# Patient Record
Sex: Female | Born: 1975 | Race: White | Hispanic: No | Marital: Married | Smoking: Former smoker
Health system: Southern US, Community
[De-identification: ages and names within clinical notes are randomized; demographics above are authoritative.]

## PROBLEM LIST (undated history)

## (undated) DIAGNOSIS — E041 Nontoxic single thyroid nodule: Secondary | ICD-10-CM

## (undated) DIAGNOSIS — E063 Autoimmune thyroiditis: Secondary | ICD-10-CM

## (undated) DIAGNOSIS — F32A Depression, unspecified: Secondary | ICD-10-CM

## (undated) DIAGNOSIS — M797 Fibromyalgia: Secondary | ICD-10-CM

## (undated) DIAGNOSIS — K219 Gastro-esophageal reflux disease without esophagitis: Secondary | ICD-10-CM

## (undated) DIAGNOSIS — R011 Cardiac murmur, unspecified: Secondary | ICD-10-CM

## (undated) DIAGNOSIS — Z87442 Personal history of urinary calculi: Secondary | ICD-10-CM

## (undated) DIAGNOSIS — R5382 Chronic fatigue, unspecified: Secondary | ICD-10-CM

## (undated) DIAGNOSIS — K649 Unspecified hemorrhoids: Secondary | ICD-10-CM

## (undated) DIAGNOSIS — K603 Anal fistula, unspecified: Secondary | ICD-10-CM

## (undated) DIAGNOSIS — F329 Major depressive disorder, single episode, unspecified: Secondary | ICD-10-CM

## (undated) HISTORY — DX: Autoimmune thyroiditis: E06.3

## (undated) HISTORY — DX: Unspecified hemorrhoids: K64.9

## (undated) HISTORY — PX: LEEP: SHX91

## (undated) HISTORY — DX: Depression, unspecified: F32.A

## (undated) HISTORY — PX: WISDOM TOOTH EXTRACTION: SHX21

## (undated) HISTORY — DX: Nontoxic single thyroid nodule: E04.1

## (undated) HISTORY — DX: Major depressive disorder, single episode, unspecified: F32.9

## (undated) HISTORY — DX: Cardiac murmur, unspecified: R01.1

## (undated) HISTORY — PX: COLONOSCOPY: SHX174

## (undated) HISTORY — DX: Chronic fatigue, unspecified: R53.82

---

## 2003-02-13 HISTORY — PX: PLACEMENT OF BREAST IMPLANTS: SHX6334

## 2011-07-31 ENCOUNTER — Ambulatory Visit (INDEPENDENT_AMBULATORY_CARE_PROVIDER_SITE_OTHER): Payer: 59 | Admitting: Internal Medicine

## 2011-07-31 VITALS — BP 117/74 | HR 60 | Temp 98.7°F | Resp 20 | Ht 67.0 in | Wt 195.0 lb

## 2011-07-31 DIAGNOSIS — R5383 Other fatigue: Secondary | ICD-10-CM

## 2011-07-31 DIAGNOSIS — E039 Hypothyroidism, unspecified: Secondary | ICD-10-CM

## 2011-07-31 DIAGNOSIS — R5381 Other malaise: Secondary | ICD-10-CM

## 2011-07-31 DIAGNOSIS — G629 Polyneuropathy, unspecified: Secondary | ICD-10-CM | POA: Insufficient documentation

## 2011-07-31 LAB — POCT CBC
Granulocyte percent: 61.8 %G (ref 37–80)
MCV: 90.8 fL (ref 80–97)
MID (cbc): 0.3 (ref 0–0.9)
MPV: 8.7 fL (ref 0–99.8)
Platelet Count, POC: 233 10*3/uL (ref 142–424)
RBC: 5.01 M/uL (ref 4.04–5.48)

## 2011-07-31 LAB — COMPREHENSIVE METABOLIC PANEL
ALT: 10 U/L (ref 0–35)
AST: 12 U/L (ref 0–37)
Albumin: 4.5 g/dL (ref 3.5–5.2)
BUN: 11 mg/dL (ref 6–23)
Calcium: 9.3 mg/dL (ref 8.4–10.5)
Chloride: 105 mEq/L (ref 96–112)
Potassium: 4.2 mEq/L (ref 3.5–5.3)
Sodium: 139 mEq/L (ref 135–145)
Total Protein: 6.9 g/dL (ref 6.0–8.3)

## 2011-07-31 LAB — T4, FREE: Free T4: 1.37 ng/dL (ref 0.80–1.80)

## 2011-07-31 NOTE — Progress Notes (Addendum)
Subjective:    Patient ID: Lindsey Cuevas, female    DOB: 18-May-1975, 36 y.o.   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 She has had hypothyroidism for 13 years. Recently she notes increased fatigue, increased weight gain, thickening of the skin, the skin irritated lesions. She also has a history of a fatigue syndrome that started when she was under a great deal of stress 2 years ago and led to the diagnosis of fibromyalgia. It included fatigue myalgias and anxiety and diarrhea. She is stable at this point though her resolution came without any specific type of medical treatment. She also has a diagnosis of a neuropathy of some sort and she noticed it first with numbness on her feet while walking. She had a peripheral nerve conduction study which confirmed a rub but the but there was no etiology. At this point it is intermittent it and seems to get worse the longer she is on her feet.  She is  everyday smoker  She is requesting a referral to endocrinologist Dr. Sharl Ma  Review of Systems  Constitutional: Negative for fever, activity change and appetite change.  HENT: Negative for hearing loss, congestion, facial swelling, mouth sores and trouble swallowing.   Eyes: Negative for discharge and visual disturbance.  Respiratory: Negative for apnea, choking and wheezing.   Cardiovascular: Negative for chest pain, palpitations and leg swelling.  Gastrointestinal: Negative for abdominal pain, diarrhea, constipation and blood in stool.  Genitourinary: Negative for dysuria, difficulty urinating, menstrual problem, pelvic pain and dyspareunia.  Musculoskeletal: Negative for arthralgias.       She has frequent myalgias but no arthralgias or joint complaints  Neurological: Negative for dizziness and headaches.  Hematological: Negative for adenopathy. Does not bruise/bleed easily.  Psychiatric/Behavioral: Negative for  hallucinations, confusion, disturbed wake/sleep cycle, dysphoric mood and decreased concentration.       Objective:   Physical Exam Vital signs stable except weight 195 HEENT clear including no thyromegaly or nodules Heart regular without murmur Lungs clear Extremities without edema Skin has several small red irritated areas that are adjacent to follicles and evolve into hyperpigmented spots but had no specific characteristics suggesting a diagnosis. Neurological-not tested on this exam for peripheral neuropathy   Results for orders placed in visit on 07/31/11  POCT CBC      Component Value Range   WBC 6.1  4.6 - 10.2 K/uL   Lymph, poc 2.0  0.6 - 3.4   POC LYMPH PERCENT 32.7  10 - 50 %L   MID (cbc) 0.3  0 - 0.9   POC MID % 5.5  0 - 12 %M   POC Granulocyte 3.8  2 - 6.9   Granulocyte percent 61.8  37 - 80 %G   RBC 5.01  4.04 - 5.48 M/uL   Hemoglobin 15.3  12.2 - 16.2 g/dL   HCT, POC 40.9  81.1 - 47.9 %   MCV 90.8  80 - 97 fL   MCH, POC 30.5  27 - 31.2 pg   MCHC 33.6  31.8 - 35.4 g/dL   RDW, POC 91.4     Platelet Count, POC 233  142 - 424 K/uL   MPV 8.7  0 - 99.8 fL       Assessment & Plan:  Impression #1 hypothyroidism #2 fatigue #3 history of diagnosis of fibromyalgia #4 history of diagnosis of neuropathy  T3-T4 and TSH ordered Will refill level thyroxine  after those results Referral to endocrinology   Addendum 08/01/2011-labs normal will refill thyroxine

## 2011-08-01 ENCOUNTER — Encounter: Payer: Self-pay | Admitting: Internal Medicine

## 2011-08-01 MED ORDER — LEVOTHYROXINE SODIUM 112 MCG PO TABS: 125.0000 ug | ORAL_TABLET | Freq: Every day | ORAL | Status: DC

## 2011-08-01 NOTE — Addendum Note (Signed)
Addended by: Tonye Pearson on: 08/01/2011 05:47 PM   Modules accepted: Orders

## 2011-08-02 ENCOUNTER — Telehealth: Payer: Self-pay

## 2011-08-02 NOTE — Telephone Encounter (Signed)
Let her know that I mailed her labs yesterday and sent in her RX, that all was WNL And I've requested an appt w/ Dr Sharl Ma

## 2011-08-02 NOTE — Telephone Encounter (Signed)
Patient needs a refill levoxyl .125.  She saw Merla Riches and he said pending her lab results, they may have to increase this medicine.  Now she is out and hasn't heard anything about her labs.  Please call

## 2011-08-04 NOTE — Telephone Encounter (Signed)
LMOM with message from Dr Reggy Eye

## 2011-09-28 ENCOUNTER — Ambulatory Visit (INDEPENDENT_AMBULATORY_CARE_PROVIDER_SITE_OTHER): Payer: 59 | Admitting: Family Medicine

## 2011-09-28 VITALS — BP 116/77 | HR 80 | Temp 99.2°F | Resp 16 | Ht 68.2 in | Wt 193.0 lb

## 2011-09-28 DIAGNOSIS — E039 Hypothyroidism, unspecified: Secondary | ICD-10-CM

## 2011-09-28 DIAGNOSIS — J029 Acute pharyngitis, unspecified: Secondary | ICD-10-CM

## 2011-09-28 LAB — POCT CBC
MCH, POC: 30 pg (ref 27–31.2)
MCHC: 31.8 g/dL (ref 31.8–35.4)
MCV: 94.4 fL (ref 80–97)
MID (cbc): 0.6 (ref 0–0.9)
POC LYMPH PERCENT: 17.4 %L (ref 10–50)
POC MID %: 5.7 %M (ref 0–12)
Platelet Count, POC: 254 10*3/uL (ref 142–424)
RDW, POC: 12.2 %
WBC: 11.2 10*3/uL — AB (ref 4.6–10.2)

## 2011-09-28 NOTE — Progress Notes (Addendum)
[facility] [address] [place] [address] [phone]  Date:  09/28/2011   Name:  Lindsey Cuevas   DOB:  Oct 28, 1975   MRN:  [id number]  PCP:  No primary provider on file.    Chief Complaint: Fatigue and Sore Throat   History of Present Illness:  Lindsey Cuevas is a 36 y.o. very pleasant female patient who presents with the following:  About 2 days ago she noted onset of ST and enlarged right tonsil.  Yesterday she felt worse and tried some throat lozenges.  Slept poorly last night due to the size of her tonsils- was going to go to work today, but felt too bad.  Symptoms are just on the right side of her throat, and her right ear hurts as well.  She is able to swallow liquids and soft foods.    She has noted chills, and has felt hot ut has not measured a temperature.  No other symptoms such as cough or GI symptoms.  She has not taken any antipyretics today.    She has felt fatigued.  They did change her thyroid medication about 6 weeks ago as her old brand was discontinued- she wonders if her TSH may be elevated. She has had problems with changes in her brand in the past.  She would like to go on brand- name synthroid is possible- she is currently on generic levothyroxine.    Recently moved to [place], she is an Charity fundraiser at [employer].  Here with her husband today.   No chance of pregnancy- last period was late July.   PHM includes hypothyroidism, fibromyalgia.     Patient Active Problem List  Diagnosis  . Neuropathy  . Hypothyroidism    No past medical history on file.  No past surgical history on file.  History  Substance Use Topics  . Smoking status: Current Everyday Smoker -- 0.5 packs/day    Types: Cigarettes  . Smokeless tobacco: Not on file  . Alcohol Use: Not on file    No family history on file.  Allergies  Allergen Reactions  . Percocet (Oxycodone-Acetaminophen) Nausea And Vomiting    Projectile vomiting.    Medication list has been reviewed  and updated.  Current Outpatient Prescriptions on File Prior to Visit  Medication Sig Dispense Refill  . cetirizine (ZYRTEC) 10 MG tablet Take 10 mg by mouth daily.      Marland Kitchen levothyroxine (SYNTHROID, LEVOTHROID) 112 MCG tablet Take 1 tablet (112 mcg total) by mouth daily.  90 tablet  3    Review of Systems:  As per HPI- otherwise negative.   Physical Examination: Filed Vitals:   09/28/11 0843  BP: 116/77  Pulse: 80  Temp: 99.2 F (37.3 C)  Resp: 16   Filed Vitals:   09/28/11 0843  Height: 5' 8.2" (1.732 m)  Weight: 193 lb (87.544 kg)   Body mass index is 29.17 kg/(m^2). Ideal Body Weight: Weight in (lb) to have BMI = 25: 165   GEN: WDWN, NAD, Non-toxic, A & O x 3 HEENT: Atraumatic, Normocephalic. Neck supple. No masses, minimal LAD.  TM wnl bilaterally, PEERL.  Oropharynx: large, firm right tonsil.  Uvula is displaced towards the left side of her oropharynx.  No visible exudate.  Suspect right tonsillar abscess.   Ears and Nose: No external deformity. CV: RRR, No M/G/R. No JVD. No thrill. No extra heart sounds. PULM: CTA B, no wheezes, crackles, rhonchi. No retractions. No resp. distress. No accessory muscle  use. EXTR: No c/c/e NEURO Normal gait.  PSYCH: Normally interactive. Conversant. Not depressed or anxious appearing.  Calm demeanor.   Results for orders placed in visit on 09/28/11  POCT RAPID STREP A (OFFICE)      Component Value Range   Rapid Strep A Screen Negative  Negative  POCT CBC      Component Value Range   WBC 11.2 (*) 4.6 - 10.2 K/uL   Lymph, poc 1.9  0.6 - 3.4   POC LYMPH PERCENT 17.4  10 - 50 %L   MID (cbc) 0.6  0 - 0.9   POC MID % 5.7  0 - 12 %M   POC Granulocyte 8.6 (*) 2 - 6.9   Granulocyte percent 76.9  37 - 80 %G   RBC 5.16  4.04 - 5.48 M/uL   Hemoglobin 15.5  12.2 - 16.2 g/dL   HCT, POC 16.1 (*) 09.6 - 47.9 %   MCV 94.4  80 - 97 fL   MCH, POC 30.0  27 - 31.2 pg   MCHC 31.8  31.8 - 35.4 g/dL   RDW, POC 04.5     Platelet Count, POC 254   142 - 424 K/uL   MPV 8.7  0 - 99.8 fL    Assessment and Plan: 1. Sore throat  POCT rapid strep A, POCT CBC, Culture, Group A Strep  2. Hypothyroidism  TSH   Await TSH- will change to brand synthroid per her request, but want to be sure we do not need to change her dosage as well first.   Pharyngitis with concern for tonsillar abscess.  As it is Friday morning, would like for her to have an ENT evaluation prior to the weekend.  Appreciate kind consultation per [facility] ENT Throat culture is pending.    Virgilene Stryker, MD  Called to check on her today- she did have an I and D of abscess yesterday.  She is doing ok.  Let her know that her TSH is ok- called in brand name synthroid per her request.

## 2011-09-29 ENCOUNTER — Encounter: Payer: Self-pay | Admitting: Family Medicine

## 2011-09-29 MED ORDER — LEVOTHYROXINE SODIUM 112 MCG PO TABS: 112.0000 ug | ORAL_TABLET | Freq: Every day | ORAL | Status: DC

## 2011-09-29 NOTE — Addendum Note (Signed)
Addended by: Abbe Amsterdam C on: 09/29/2011 02:01 PM   Modules accepted: Orders

## 2011-09-30 LAB — CULTURE, GROUP A STREP: Organism ID, Bacteria: NORMAL

## 2011-10-01 ENCOUNTER — Encounter: Payer: Self-pay | Admitting: Family Medicine

## 2011-10-15 HISTORY — PX: INCISION AND DRAINAGE PERITONSILLAR ABSCESS: SUR670

## 2011-11-01 ENCOUNTER — Other Ambulatory Visit: Payer: Self-pay | Admitting: Internal Medicine

## 2011-11-01 DIAGNOSIS — E049 Nontoxic goiter, unspecified: Secondary | ICD-10-CM

## 2011-11-12 ENCOUNTER — Other Ambulatory Visit: Payer: Self-pay

## 2011-11-14 ENCOUNTER — Ambulatory Visit
Admission: RE | Admit: 2011-11-14 | Discharge: 2011-11-14 | Disposition: A | Discharge status: Home / Self Care | Payer: Self-pay | Source: Ambulatory Visit | Attending: Internal Medicine | Admitting: Internal Medicine

## 2011-11-14 DIAGNOSIS — E049 Nontoxic goiter, unspecified: Secondary | ICD-10-CM

## 2011-11-22 ENCOUNTER — Other Ambulatory Visit: Payer: Self-pay | Admitting: Internal Medicine

## 2011-11-22 DIAGNOSIS — E041 Nontoxic single thyroid nodule: Secondary | ICD-10-CM

## 2011-11-25 HISTORY — PX: BIOPSY THYROID: PRO38

## 2011-12-13 ENCOUNTER — Other Ambulatory Visit (HOSPITAL_COMMUNITY)
Admission: RE | Admit: 2011-12-13 | Discharge: 2011-12-13 | Disposition: A | Discharge status: Home / Self Care | Payer: 59 | Source: Ambulatory Visit | Attending: Interventional Radiology | Admitting: Interventional Radiology

## 2011-12-13 ENCOUNTER — Ambulatory Visit
Admission: RE | Admit: 2011-12-13 | Discharge: 2011-12-13 | Disposition: A | Discharge status: Home / Self Care | Payer: 59 | Source: Ambulatory Visit | Attending: Internal Medicine | Admitting: Internal Medicine

## 2011-12-13 DIAGNOSIS — E041 Nontoxic single thyroid nodule: Secondary | ICD-10-CM

## 2011-12-13 DIAGNOSIS — E049 Nontoxic goiter, unspecified: Secondary | ICD-10-CM | POA: Insufficient documentation

## 2012-04-02 ENCOUNTER — Ambulatory Visit: Payer: 59 | Admitting: Family Medicine

## 2012-05-12 ENCOUNTER — Other Ambulatory Visit: Payer: Self-pay | Admitting: Occupational Medicine

## 2012-05-12 ENCOUNTER — Ambulatory Visit: Payer: Self-pay

## 2012-05-12 DIAGNOSIS — R7612 Nonspecific reaction to cell mediated immunity measurement of gamma interferon antigen response without active tuberculosis: Secondary | ICD-10-CM

## 2012-05-28 ENCOUNTER — Encounter: Payer: Self-pay | Admitting: Family Medicine

## 2012-05-28 ENCOUNTER — Ambulatory Visit (INDEPENDENT_AMBULATORY_CARE_PROVIDER_SITE_OTHER): Payer: 59 | Admitting: Family Medicine

## 2012-05-28 VITALS — BP 100/78 | HR 78 | Temp 98.3°F | Ht 67.0 in | Wt 185.0 lb

## 2012-05-28 DIAGNOSIS — F329 Major depressive disorder, single episode, unspecified: Secondary | ICD-10-CM | POA: Insufficient documentation

## 2012-05-28 DIAGNOSIS — G629 Polyneuropathy, unspecified: Secondary | ICD-10-CM

## 2012-05-28 DIAGNOSIS — E063 Autoimmune thyroiditis: Secondary | ICD-10-CM | POA: Insufficient documentation

## 2012-05-28 DIAGNOSIS — Z136 Encounter for screening for cardiovascular disorders: Secondary | ICD-10-CM

## 2012-05-28 DIAGNOSIS — G589 Mononeuropathy, unspecified: Secondary | ICD-10-CM

## 2012-05-28 DIAGNOSIS — E039 Hypothyroidism, unspecified: Secondary | ICD-10-CM

## 2012-05-28 LAB — COMPREHENSIVE METABOLIC PANEL
ALT: 14 U/L (ref 0–35)
BUN: 10 mg/dL (ref 6–23)
CO2: 27 mEq/L (ref 19–32)
Calcium: 9.1 mg/dL (ref 8.4–10.5)
Chloride: 104 mEq/L (ref 96–112)
Creatinine, Ser: 0.8 mg/dL (ref 0.4–1.2)
GFR: 86.44 mL/min (ref >60.00)
Glucose, Bld: 88 mg/dL (ref 70–99)
Total Bilirubin: 0.8 mg/dL (ref 0.3–1.2)

## 2012-05-28 LAB — TSH: TSH: 1.13 u[IU]/mL (ref 0.35–5.50)

## 2012-05-28 LAB — LIPID PANEL
Cholesterol: 112 mg/dL (ref 0–200)
HDL: 47 mg/dL (ref >39.00)
Triglycerides: 77 mg/dL (ref 0.0–149.0)
VLDL: 15.4 mg/dL (ref 0.0–40.0)

## 2012-05-28 NOTE — Progress Notes (Signed)
Subjective:    Patient ID: Lindsey Cuevas, female    DOB: 1975-04-30, 37 y.o.   MRN: [id number]  HPI  Very pleasant 37 yo female here to establish care.  I see her husband, Ernie.  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  Hashimotos- followed by Dr. Sharl Ma.  On Synthroid 112 mcg daily. She would like her labs rechecked today. Feels more fatigued.  Her hair is falling out which typically happens when she is hypothyroid. Did have neg thyroid nodule biopsy last year.  H/o fibromyalgia- in 2011- had severe pain while she was under stress.  Currently denies any pain or symptoms of anxiety or depression.  Patient Active Problem List  Diagnosis  . Neuropathy  . Hypothyroidism  . Hashimoto's disease  . Depression   Past Medical History  Diagnosis Date  . Thyroid disease   . Hashimoto's disease   . Thyroid nodule   . Fibromyalgia syndrome   . Depression    Past Surgical History  Procedure Laterality Date  . Biopsy thyroid Right 11/25/2011    Neg  . Incision and drainage peritonsillar abscess Right 10/15/2011    Dr. Jenne Pane   History  Substance Use Topics  . Smoking status: Current Every Day Smoker -- 0.50 packs/day    Types: Cigarettes  . Smokeless tobacco: Not on file  . Alcohol Use: Not on file   Family History  Problem Relation Age of Onset  . Cancer Mother 21    colon CA  . Asthma Father   . Hyperlipidemia Father   . Hypertension Father   . Inflammatory bowel disease Brother   . Cancer Maternal Grandmother     breast CA   Allergies  Allergen Reactions  . Percocet (Oxycodone-Acetaminophen) Nausea And Vomiting    Projectile vomiting.   Current Outpatient Prescriptions on File Prior to Visit  Medication Sig Dispense Refill  . cetirizine (ZYRTEC) 10 MG tablet Take 10 mg by mouth daily.       No current facility-administered medications on file prior to visit.   The PMH, PSH, Social History, Family History, Medications, and allergies have been  reviewed in [facility], and have been updated if relevant.   Review of Systems See HPI Patient reports no  vision/ hearing changes,anorexia, weight change, fever ,adenopathy, persistant / recurrent hoarseness, swallowing issues, chest pain, edema,persistant / recurrent cough, hemoptysis, dyspnea(rest, exertional, paroxysmal nocturnal), gastrointestinal  bleeding (melena, rectal bleeding), abdominal pain, excessive heart burn, GU symptoms(dysuria, hematuria, pyuria, voiding/incontinence  Issues) syncope, focal weakness, severe memory loss, concerning skin lesions, depression, anxiety, abnormal bruising/bleeding, major joint swelling, breast masses or abnormal vaginal bleeding.       Objective:   Physical Exam BP 100/78  Pulse 78  Temp(Src) 98.3 F (36.8 C)  Ht 5\' 7"  (1.702 m)  Wt 185 lb (83.915 kg)  BMI 28.97 kg/m2  General:  Well-developed,well-nourished,in no acute distress; alert,appropriate and cooperative throughout examination Head:  normocephalic and atraumatic.   Eyes:  vision grossly intact, pupils equal, pupils round, and pupils reactive to light.   Ears:  R ear normal and L ear normal.   Nose:  no external deformity.   Mouth:  good dentition.   Neck:  No deformities, + enlarged thyroid, non tender, no nodules palpated. Lungs:  Normal respiratory effort, chest expands symmetrically. Lungs are clear to auscultation, no crackles or wheezes. Heart:  Normal rate and regular rhythm. + murmur Abdomen:  Bowel sounds positive,abdomen soft and non-tender without masses, organomegaly  or hernias noted. Msk:  No deformity or scoliosis noted of thoracic or lumbar spine.   Extremities:  No clubbing, cyanosis, edema, or deformity noted with normal full range of motion of all joints.   Neurologic:  alert & oriented X3 and gait normal.   Skin:  Intact without suspicious lesions or rashes Cervical Nodes:  No lymphadenopathy noted Axillary Nodes:  No palpable lymphadenopathy Psych:  Cognition and  judgment appear intact. Alert and cooperative with normal attention span and concentration. No apparent delusions, illusions, hallucinations     Assessment & Plan:  1. Hypothyroidism Recheck labs today. Continue current dose of synthroid. - Comprehensive metabolic panel - TSH - T4, Free  2. Neuropathy Asymptomatic.  3. Screening for ischemic heart disease  - Lipid Panel  4. Depression Currently no symptoms anxiety or depression.  5. Hashimoto's disease

## 2012-05-28 NOTE — Patient Instructions (Addendum)
[facility] ( across from [facility]). I see Dr. Vincente Poli.  I will call you with your lab results and send them to Dr. Sharl Ma. Good luck with your new job.

## 2012-05-29 ENCOUNTER — Encounter: Payer: Self-pay | Admitting: *Deleted

## 2012-06-24 ENCOUNTER — Encounter: Payer: Self-pay | Admitting: Family Medicine

## 2012-06-24 ENCOUNTER — Ambulatory Visit (INDEPENDENT_AMBULATORY_CARE_PROVIDER_SITE_OTHER): Payer: 59 | Admitting: Family Medicine

## 2012-06-24 ENCOUNTER — Telehealth: Payer: Self-pay | Admitting: Family Medicine

## 2012-06-24 VITALS — BP 124/74 | HR 66 | Temp 99.1°F | Ht 67.0 in | Wt 188.2 lb

## 2012-06-24 DIAGNOSIS — J019 Acute sinusitis, unspecified: Secondary | ICD-10-CM

## 2012-06-24 DIAGNOSIS — B9689 Other specified bacterial agents as the cause of diseases classified elsewhere: Secondary | ICD-10-CM

## 2012-06-24 MED ORDER — AMOXICILLIN-POT CLAVULANATE 875-125 MG PO TABS: 1.0000 | ORAL_TABLET | Freq: Two times a day (BID) | ORAL | Status: DC

## 2012-06-24 NOTE — Telephone Encounter (Signed)
appt scheduled today 06/24/12 @ 4:00

## 2012-06-24 NOTE — Telephone Encounter (Signed)
Patient Information:  Caller Name: Lugenia  Phone: [phone]  Patient: Lindsey Cuevas, Lindsey Cuevas  Gender: Female  DOB: [birth year]  Age: 37 Years  PCP: Ruthe Mannan [facility])  Pregnant: No  Office Follow Up:  Does the office need to follow up with this patient?: No  Instructions For The Office: N/A  RN Note:  Patient is made aware of office policy. Must be seen for antiboitics.  Appt scheduled for care  Symptoms  Reason For Call & Symptoms: She believes she has a sinus infection.  Onset with sore throat that has moved to sinues.  Thick green yellow discharge. Low grade fever in the evening (tactile). Occasionally runny nose, cough productive yellow, occasionally headache with pressure. ear pressure.  She is requesting medicaiton antibiotics due to work schedule  Reviewed Health History In EMR: Yes  Reviewed Medications In EMR: Yes  Reviewed Allergies In EMR: Yes  Reviewed Surgeries / Procedures: Yes  Date of Onset of Symptoms: 06/15/2012  Treatments Tried: Airborne, sudafed,  Treatments Tried Worked: No OB / GYN:  LMP: 05/31/2012  Guideline(s) Used:  Sinus Pain and Congestion  Disposition Per Guideline:   See Today or Tomorrow in Office  Reason For Disposition Reached:   Sinus congestion (pressure, fullness) present > 10 days  Advice Given:  For a Stuffy Nose - Use Nasal Washes:  Introduction: Saline (salt water) nasal irrigation (nasal wash) is an effective and simple home remedy for treating stuffy nose and sinus congestion. The nose can be irrigated by pouring, spraying, or squirting salt water into the nose and then letting it run back out.  How it Helps: The salt water rinses out excess mucus, washes out any irritants (dust, allergens) that might be present, and moistens the nasal cavity.  Methods: There are several ways to perform nasal irrigation. You can use a saline nasal spray bottle (available over-the-counter), a rubber ear syringe, a medical syringe without the needle, or  a Neti Pot.  Hydration:  Drink plenty of liquids (6-8 glasses of water daily). If the air in your home is dry, use a cool mist humidifier  Call Back If:   Sinus congestion (fullness) lasts longer than 10 days  Fever lasts longer than 3 days  You become worse.  Patient Will Follow Care Advice:  YES  Appointment Scheduled:  06/24/2012 16:00:00 Appointment Scheduled Provider:  Roxy Manns [facility])

## 2012-06-24 NOTE — Assessment & Plan Note (Signed)
tx with augmentin Disc symptomatic care - see instructions on AVS  Update if not starting to improve in a week or if worsening

## 2012-06-24 NOTE — Telephone Encounter (Signed)
Will see her then 

## 2012-06-24 NOTE — Patient Instructions (Addendum)
Take augmentin for a sinus infection  The cold still has to run its course For symptoms -mucinex twice daily  Ibuprofen up to every 6 hours with food  Stop the sudafed at night  Get lots of rest and drink fluids  Nasal saline spray is great also to help clear sinuses  Update if not starting to improve in a week or if worsening

## 2012-06-24 NOTE — Progress Notes (Signed)
Subjective:    Patient ID: Lindsey Cuevas, female    DOB: [birth year], 37 y.o.   MRN: [id number]  HPI Sick for 10 days with uri symptoms  Started with a bad st - then congestion and facial pain and pressure 1 week into it - yellow/ green nasal drainage and also coughing it up  Ears got stuffy and full - got some sudafed for that  Drinking lots of water Taking mucinex   Sudafed is giving her problems sleeping  Not sleeping well at all   Fever today of 99.1   Patient Active Problem List   Diagnosis Date Noted  . Hashimoto's disease   . Depression   . Neuropathy 07/31/2011  . Hypothyroidism 07/31/2011   Past Medical History  Diagnosis Date  . Thyroid disease   . Hashimoto's disease   . Thyroid nodule   . Fibromyalgia syndrome   . Depression    Past Surgical History  Procedure Laterality Date  . Biopsy thyroid Right 11/25/2011    Neg  . Incision and drainage peritonsillar abscess Right 10/15/2011    Dr. Jenne Pane   History  Substance Use Topics  . Smoking status: Current Every Day Smoker -- 0.50 packs/day    Types: Cigarettes  . Smokeless tobacco: Not on file  . Alcohol Use: No   Family History  Problem Relation Age of Onset  . Cancer Mother 51    colon CA  . Asthma Father   . Hyperlipidemia Father   . Hypertension Father   . Inflammatory bowel disease Brother   . Cancer Maternal Grandmother     breast CA   Allergies  Allergen Reactions  . Percocet (Oxycodone-Acetaminophen) Nausea And Vomiting    Projectile vomiting.   Current Outpatient Prescriptions on File Prior to Visit  Medication Sig Dispense Refill  . levothyroxine (SYNTHROID, LEVOTHROID) 112 MCG tablet Take 112 mcg by mouth daily. Patient prefers branded synthroid please      . Multiple Vitamin (MULTIVITAMIN) tablet Take 1 tablet by mouth daily.       No current facility-administered medications on file prior to visit.      Review of Systems Review of Systems  Constitutional: Negative for appetite  change, and unexpected weight change.  ENt pos for cong/ sinus pain and purulent nasal d/c , neg for ear drainage  Eyes: Negative for pain and visual disturbance.  Respiratory: Negative for wheeze  and shortness of breath.   Cardiovascular: Negative for cp or palpitations    Gastrointestinal: Negative for nausea, diarrhea and constipation.  Genitourinary: Negative for urgency and frequency.  Skin: Negative for pallor or rash   Neurological: Negative for weakness, light-headedness, numbness and headaches.  Hematological: Negative for adenopathy. Does not bruise/bleed easily.  Psychiatric/Behavioral: Negative for dysphoric mood. The patient is not nervous/anxious.         Objective:   Physical Exam  Constitutional: She appears well-developed and well-nourished. No distress.  HENT:  Head: Normocephalic and atraumatic.  Right Ear: External ear normal.  Left Ear: External ear normal.  Mouth/Throat: Oropharynx is clear and moist. No oropharyngeal exudate.  Nares are injected and congested  bilat maxillary sinus tenderness Clear post nasal drip  Tms are dull  Eyes: Conjunctivae and EOM are normal. Pupils are equal, round, and reactive to light. Right eye exhibits no discharge. Left eye exhibits no discharge.  Neck: Normal range of motion. Neck supple.  Cardiovascular: Normal rate and regular rhythm.   Pulmonary/Chest: Effort normal and breath sounds normal. No respiratory distress.  She has no wheezes. She has no rales.  Lymphadenopathy:    She has no cervical adenopathy.  Neurological: She is alert. No cranial nerve deficit.  Skin: Skin is warm and dry. No rash noted.  Psychiatric: She has a normal mood and affect.          Assessment & Plan:

## 2012-10-27 ENCOUNTER — Ambulatory Visit (INDEPENDENT_AMBULATORY_CARE_PROVIDER_SITE_OTHER): Payer: 59 | Admitting: Family Medicine

## 2012-10-27 ENCOUNTER — Encounter: Payer: Self-pay | Admitting: Family Medicine

## 2012-10-27 VITALS — BP 112/82 | HR 84 | Temp 98.9°F | Ht 67.0 in | Wt 182.0 lb

## 2012-10-27 DIAGNOSIS — E063 Autoimmune thyroiditis: Secondary | ICD-10-CM

## 2012-10-27 DIAGNOSIS — L659 Nonscarring hair loss, unspecified: Secondary | ICD-10-CM

## 2012-10-27 DIAGNOSIS — R5381 Other malaise: Secondary | ICD-10-CM

## 2012-10-27 DIAGNOSIS — E039 Hypothyroidism, unspecified: Secondary | ICD-10-CM

## 2012-10-27 LAB — COMPREHENSIVE METABOLIC PANEL
ALT: 16 U/L (ref 0–35)
Albumin: 4.6 g/dL (ref 3.5–5.2)
Alkaline Phosphatase: 63 U/L (ref 39–117)
CO2: 30 mEq/L (ref 19–32)
Potassium: 4 mEq/L (ref 3.5–5.1)
Sodium: 138 mEq/L (ref 135–145)
Total Bilirubin: 0.6 mg/dL (ref 0.3–1.2)
Total Protein: 7.7 g/dL (ref 6.0–8.3)

## 2012-10-27 LAB — CBC WITH DIFFERENTIAL/PLATELET
Basophils Absolute: 0 10*3/uL (ref 0.0–0.1)
Eosinophils Relative: 1.5 % (ref 0.0–5.0)
HCT: 46.3 % — ABNORMAL HIGH (ref 36.0–46.0)
Lymphs Abs: 1.6 10*3/uL (ref 0.7–4.0)
Monocytes Absolute: 0.4 10*3/uL (ref 0.1–1.0)
Monocytes Relative: 4.7 % (ref 3.0–12.0)
Neutrophils Relative %: 72.3 % (ref 43.0–77.0)
Platelets: 254 10*3/uL (ref 150.0–400.0)
RDW: 11.6 % (ref 11.5–14.6)
WBC: 7.7 10*3/uL (ref 4.5–10.5)

## 2012-10-27 LAB — TSH: TSH: 2.2 u[IU]/mL (ref 0.35–5.50)

## 2012-10-27 LAB — VITAMIN B12: Vitamin B-12: 339 pg/mL (ref 211–911)

## 2012-10-27 LAB — LUTEINIZING HORMONE: LH: 8.05 m[IU]/mL

## 2012-10-27 LAB — T4, FREE: Free T4: 0.95 ng/dL (ref 0.60–1.60)

## 2012-10-27 MED ORDER — LEVOTHYROXINE SODIUM 112 MCG PO TABS: 112.0000 ug | ORAL_TABLET | Freq: Every day | ORAL | Status: DC

## 2012-10-27 NOTE — Patient Instructions (Addendum)
Good to see you. We will call you with your lab results.   

## 2012-10-27 NOTE — Progress Notes (Signed)
Subjective:    Patient ID: Lindsey Cuevas, female    DOB: [birth year], 37 y.o.   MRN: [id number]  HPI  Very pleasant 37 yo female for "not feeling well, hair falling out."  She is an Charity fundraiser at [employer], recently promoted to case Production designer, theatre/television/film in ER.  Hashimotos- followed by Dr. Sharl Ma.  On Synthroid 112 mcg daily.  Was previously on Levoxyl and felt that worked better.  Lab Results  Component Value Date   TSH 1.13 05/28/2012    Feels more fatigued.  Her hair is falling out "in clumps."   Feels like it is worse around her period.  Also noticed a rash on her face during her period.  She does have bouts of diarrhea but otherwise bowels or normal.  H/o fibromyalgia- in 2011- had severe pain while she was under stress.  Currently denies any pain or symptoms of anxiety or depression.  Patient Active Problem List   Diagnosis Date Noted  . Hashimoto's disease   . Depression   . Neuropathy 07/31/2011  . Hypothyroidism 07/31/2011   Past Medical History  Diagnosis Date  . Thyroid disease   . Hashimoto's disease   . Thyroid nodule   . Fibromyalgia syndrome   . Depression    Past Surgical History  Procedure Laterality Date  . Biopsy thyroid Right 11/25/2011    Neg  . Incision and drainage peritonsillar abscess Right 10/15/2011    Dr. Jenne Pane   History  Substance Use Topics  . Smoking status: Current Every Day Smoker -- 0.50 packs/day    Types: Cigarettes  . Smokeless tobacco: Not on file  . Alcohol Use: No   Family History  Problem Relation Age of Onset  . Cancer Mother 76    colon CA  . Asthma Father   . Hyperlipidemia Father   . Hypertension Father   . Inflammatory bowel disease Brother   . Cancer Maternal Grandmother     breast CA   Allergies  Allergen Reactions  . Percocet [Oxycodone-Acetaminophen] Nausea And Vomiting    Projectile vomiting.   Current Outpatient Prescriptions on File Prior to Visit  Medication Sig Dispense Refill  . amoxicillin-clavulanate (AUGMENTIN) 875-125 MG per  tablet Take 1 tablet by mouth 2 (two) times daily.  14 tablet  0  . levothyroxine (SYNTHROID, LEVOTHROID) 112 MCG tablet Take 112 mcg by mouth daily. Patient prefers branded synthroid please      . Multiple Vitamin (MULTIVITAMIN) tablet Take 1 tablet by mouth daily.       No current facility-administered medications on file prior to visit.   The PMH, PSH, Social History, Family History, Medications, and allergies have been reviewed in [facility], and have been updated if relevant.   Review of Systems See HPI      Objective:   Physical Exam BP 112/82  Pulse 84  Temp(Src) 98.9 F (37.2 C)  Ht 5\' 7"  (1.702 m)  Wt 182 lb (82.555 kg)  BMI 28.5 kg/m2  General:  Well-developed,well-nourished,in no acute distress; alert,appropriate and cooperative throughout examination Head:  normocephalic and atraumatic.  Hair does appear thinner, scalp more visible  Eyes:  vision grossly intact, pupils equal, pupils round, and pupils reactive to light.   Ears:  R ear normal and L ear normal.   Nose:  no external deformity.   Mouth:  good dentition.   Neck:  No deformities, + enlarged thyroid, non tender, no nodules palpated. Abdomen:  Bowel sounds positive,abdomen soft and non-tender without masses, organomegaly or hernias noted. Msk:  No deformity or scoliosis noted of thoracic or lumbar spine.   Extremities:  No clubbing, cyanosis, edema, or deformity noted with normal full range of motion of all joints.   Neurologic:  alert & oriented X3 and gait normal.   Skin:  Intact without suspicious lesions or rashes Cervical Nodes:  No lymphadenopathy noted Axillary Nodes:  No palpable lymphadenopathy Psych:  Cognition and judgment appear intact. Alert and cooperative with normal attention span and concentration. No apparent delusions, illusions, hallucinations Tearful when she talks about her hair loss     Assessment & Plan:  1. Hashimoto's disease  - TSH - T4, Free  2. Hypothyroidism  - TSH - T4,  Free  3. Falling hair Wide differential diagnosis- may simply be hair thinning or female pattern baldness.  She already has an appt with dermatology which I encouraged her to keep.  Based on her other symptoms as well, will also get a wide lab panel today for further evaluation.  Due to menstrual irregularities, she may benefit from OCPs.  She will think about this. - Follicle Stimulating Hormone - Luteinizing hormone - Vitamin D, 25-hydroxy - Vitamin B12 - CBC with Differential - Comprehensive metabolic panel  4. Other malaise and fatigue See above. - ANA - Sedimentation Rate - Cyclic Citrul Peptide Antibody, IGG - Rheumatoid Factor

## 2012-10-28 LAB — CYCLIC CITRUL PEPTIDE ANTIBODY, IGG: Cyclic Citrullin Peptide Ab: <2 U/mL (ref 0.0–5.0)

## 2012-10-28 LAB — ANTI-NUCLEAR AB-TITER (ANA TITER): ANA Titer 1: NEGATIVE

## 2012-10-28 LAB — RHEUMATOID FACTOR: Rhuematoid fact SerPl-aCnc: <10 IU/mL (ref <=14)

## 2012-10-30 ENCOUNTER — Other Ambulatory Visit: Payer: Self-pay | Admitting: Family Medicine

## 2012-10-30 DIAGNOSIS — R768 Other specified abnormal immunological findings in serum: Secondary | ICD-10-CM

## 2012-10-30 DIAGNOSIS — L659 Nonscarring hair loss, unspecified: Secondary | ICD-10-CM

## 2012-12-18 ENCOUNTER — Other Ambulatory Visit: Payer: Self-pay

## 2013-01-21 ENCOUNTER — Other Ambulatory Visit: Payer: Self-pay | Admitting: Family Medicine

## 2013-01-21 DIAGNOSIS — R7989 Other specified abnormal findings of blood chemistry: Secondary | ICD-10-CM

## 2013-01-29 ENCOUNTER — Other Ambulatory Visit (INDEPENDENT_AMBULATORY_CARE_PROVIDER_SITE_OTHER): Payer: 59

## 2013-01-29 DIAGNOSIS — R7989 Other specified abnormal findings of blood chemistry: Secondary | ICD-10-CM

## 2013-01-29 LAB — CBC WITH DIFFERENTIAL/PLATELET
Basophils Absolute: 0.1 10*3/uL (ref 0.0–0.1)
Eosinophils Absolute: 0.3 10*3/uL (ref 0.0–0.7)
Hemoglobin: 15.2 g/dL — ABNORMAL HIGH (ref 12.0–15.0)
Lymphs Abs: 2 10*3/uL (ref 0.7–4.0)
MCHC: 34.8 g/dL (ref 30.0–36.0)
Monocytes Absolute: 0.4 10*3/uL (ref 0.1–1.0)
Platelets: 251 10*3/uL (ref 150.0–400.0)
RBC: 4.91 Mil/uL (ref 3.87–5.11)
RDW: 11.3 % — ABNORMAL LOW (ref 11.5–14.6)
WBC: 6.4 10*3/uL (ref 4.5–10.5)

## 2013-02-17 ENCOUNTER — Other Ambulatory Visit: Payer: Self-pay | Admitting: Internal Medicine

## 2013-02-17 DIAGNOSIS — E042 Nontoxic multinodular goiter: Secondary | ICD-10-CM

## 2013-02-25 ENCOUNTER — Ambulatory Visit
Admission: RE | Admit: 2013-02-25 | Discharge: 2013-02-25 | Disposition: A | Discharge status: Home / Self Care | Payer: 59 | Source: Ambulatory Visit | Attending: Internal Medicine | Admitting: Internal Medicine

## 2013-02-25 DIAGNOSIS — E042 Nontoxic multinodular goiter: Secondary | ICD-10-CM

## 2013-06-25 ENCOUNTER — Ambulatory Visit (INDEPENDENT_AMBULATORY_CARE_PROVIDER_SITE_OTHER): Payer: 59 | Admitting: Family Medicine

## 2013-06-25 ENCOUNTER — Encounter: Payer: Self-pay | Admitting: Family Medicine

## 2013-06-25 VITALS — BP 116/68 | HR 76 | Temp 98.2°F | Ht 67.0 in | Wt 186.8 lb

## 2013-06-25 DIAGNOSIS — K625 Hemorrhage of anus and rectum: Secondary | ICD-10-CM

## 2013-06-25 DIAGNOSIS — Z800 Family history of malignant neoplasm of digestive organs: Secondary | ICD-10-CM

## 2013-06-25 MED ORDER — HYDROCORTISONE ACETATE 25 MG RE SUPP: 25.0000 mg | Freq: Two times a day (BID) | RECTAL | Status: DC

## 2013-06-25 NOTE — Progress Notes (Signed)
   Subjective:   Patient ID: Lindsey Cuevas, female    DOB: [birth year], 38 y.o.   MRN: [id number]  Lindsey Cuevas is a pleasant 38 y.o. year old female who presents to clinic today with Rectal Bleeding  on 06/25/2013  HPI: 2 months of bright red blood per rectum with every BM. Has pressure but not pain with or without BM.  Yesterday, she noticed a "fishy" discharge from her rectum.  No mucous in stools.  No abdominal pain, fevers, nausea or vomiting. Significant family colon history- Mom had Colon CA in early 28s- Tranisha has had two colonoscopies because of this- last one was over 10 years ago. Found out that her maternal uncle died of colon CA in his 23s. Brother has Crohn's disease.  Patient Active Problem List   Diagnosis Date Noted  . Rectal bleeding 06/25/2013  . Family history of colon cancer 06/25/2013  . Falling hair 10/27/2012  . Other malaise and fatigue 10/27/2012  . Hashimoto's disease   . Depression   . Neuropathy 07/31/2011  . Hypothyroidism 07/31/2011   Past Medical History  Diagnosis Date  . Thyroid disease   . Hashimoto's disease   . Thyroid nodule   . Fibromyalgia syndrome   . Depression    Past Surgical History  Procedure Laterality Date  . Biopsy thyroid Right 11/25/2011    Neg  . Incision and drainage peritonsillar abscess Right 10/15/2011    Dr. Redmond Baseman   History  Substance Use Topics  . Smoking status: Current Every Day Smoker -- 0.50 packs/day    Types: Cigarettes  . Smokeless tobacco: Not on file  . Alcohol Use: No   Family History  Problem Relation Age of Onset  . Cancer Mother 62    colon CA  . Asthma Father   . Hyperlipidemia Father   . Hypertension Father   . Inflammatory bowel disease Brother   . Cancer Maternal Grandmother     breast CA   Allergies  Allergen Reactions  . Percocet [Oxycodone-Acetaminophen] Nausea And Vomiting    Projectile vomiting.   Current Outpatient Prescriptions on File Prior to Visit  Medication Sig Dispense  Refill  . Multiple Vitamin (MULTIVITAMIN) tablet Take 1 tablet by mouth daily.       No current facility-administered medications on file prior to visit.   The PMH, PSH, Social History, Family History, Medications, and allergies have been reviewed in [facility], and have been updated if relevant.   Review of Systems    See HPI Objective:    BP 116/68  Pulse 76  Temp(Src) 98.2 F (36.8 C) (Oral)  Ht 5\' 7"  (1.702 m)  Wt 186 lb 12 oz (84.709 kg)  BMI 29.24 kg/m2  SpO2 98%  LMP 05/27/2013   Physical Exam  Gen:  Alert, pleasant, NAD Rectal: No external hemorrhoids No palpable fissues or fistual Normal rectal tone  guiac pos      Assessment & Plan:   Rectal bleeding - Plan: Ambulatory referral to Gastroenterology  Family history of colon cancer - Plan: Ambulatory referral to Gastroenterology No Follow-up on file.

## 2013-06-25 NOTE — Assessment & Plan Note (Signed)
Concerning given family history. Does not have classic symptoms of IBD. Either way, she needs GI referral for colonoscopy. Given eRx for steroid suppository to use for short period of time- ?internal hemorrhoids.

## 2013-06-25 NOTE — Progress Notes (Signed)
Pre visit review using our clinic review tool, if applicable. No additional management support is needed unless otherwise documented below in the visit note. 

## 2013-06-25 NOTE — Patient Instructions (Signed)
Great to see you. Please go to front desk and let them know you need to set up a referral or they will call if you if this is not an urgent referral.  Either [name] or [name] will help you set it up.  Lindsey Cuevas

## 2013-06-26 ENCOUNTER — Telehealth: Payer: Self-pay | Admitting: Family Medicine

## 2013-06-26 NOTE — Telephone Encounter (Signed)
Relevant patient education assigned to patient using Emmi. ° °

## 2013-06-29 ENCOUNTER — Ambulatory Visit: Payer: Self-pay | Admitting: Gastroenterology

## 2013-06-29 ENCOUNTER — Emergency Department: Payer: Self-pay | Admitting: Emergency Medicine

## 2013-06-29 ENCOUNTER — Telehealth: Payer: Self-pay

## 2013-06-29 LAB — COMPREHENSIVE METABOLIC PANEL
ALK PHOS: 63 U/L
ALT: 18 U/L (ref 12–78)
Albumin: 3.8 g/dL (ref 3.4–5.0)
Anion Gap: 9 (ref 7–16)
BILIRUBIN TOTAL: 0.4 mg/dL (ref 0.2–1.0)
BUN: 15 mg/dL (ref 7–18)
CHLORIDE: 108 mmol/L — AB (ref 98–107)
CO2: 25 mmol/L (ref 21–32)
Calcium, Total: 8.7 mg/dL (ref 8.5–10.1)
Creatinine: 1.1 mg/dL (ref 0.60–1.30)
EGFR (African American): >60
GLUCOSE: 100 mg/dL — AB (ref 65–99)
Osmolality: 284 (ref 275–301)
POTASSIUM: 3.5 mmol/L (ref 3.5–5.1)
SGOT(AST): 23 U/L (ref 15–37)
Sodium: 142 mmol/L (ref 136–145)
Total Protein: 6.9 g/dL (ref 6.4–8.2)

## 2013-06-29 LAB — CBC
HCT: 43.1 % (ref 35.0–47.0)
HGB: 14.5 g/dL (ref 12.0–16.0)
MCH: 30.2 pg (ref 26.0–34.0)
MCHC: 33.7 g/dL (ref 32.0–36.0)
MCV: 90 fL (ref 80–100)
Platelet: 186 10*3/uL (ref 150–440)
RBC: 4.81 10*6/uL (ref 3.80–5.20)
RDW: 11.7 % (ref 11.5–14.5)
WBC: 6 10*3/uL (ref 3.6–11.0)

## 2013-06-29 NOTE — Telephone Encounter (Signed)
Mr Oscarson left note that pt was to have GI appt today but pt went to [facility] ED this morning at 4 AM with kidney stone and is currently at home in bed. Pt has not passed kidney stone yet. Mr Rickerson wanted Dr Deborra Medina to be aware. Request for ED med records faxed to [facility].

## 2013-07-03 ENCOUNTER — Encounter: Payer: Self-pay | Admitting: Gastroenterology

## 2013-07-03 ENCOUNTER — Ambulatory Visit (INDEPENDENT_AMBULATORY_CARE_PROVIDER_SITE_OTHER): Payer: 59 | Admitting: Gastroenterology

## 2013-07-03 ENCOUNTER — Other Ambulatory Visit (INDEPENDENT_AMBULATORY_CARE_PROVIDER_SITE_OTHER): Payer: 59

## 2013-07-03 VITALS — BP 122/80 | HR 80 | Ht 67.0 in | Wt 187.6 lb

## 2013-07-03 DIAGNOSIS — K625 Hemorrhage of anus and rectum: Secondary | ICD-10-CM

## 2013-07-03 DIAGNOSIS — R198 Other specified symptoms and signs involving the digestive system and abdomen: Secondary | ICD-10-CM

## 2013-07-03 DIAGNOSIS — R194 Change in bowel habit: Secondary | ICD-10-CM

## 2013-07-03 DIAGNOSIS — Z800 Family history of malignant neoplasm of digestive organs: Secondary | ICD-10-CM

## 2013-07-03 LAB — CBC
HEMATOCRIT: 43.5 % (ref 36.0–46.0)
HEMOGLOBIN: 15 g/dL (ref 12.0–15.0)
MCHC: 34.5 g/dL (ref 30.0–36.0)
MCV: 89 fl (ref 78.0–100.0)
PLATELETS: 236 10*3/uL (ref 150.0–400.0)
RBC: 4.88 Mil/uL (ref 3.87–5.11)
RDW: 11.5 % (ref 11.5–15.5)
WBC: 4.6 10*3/uL (ref 4.0–10.5)

## 2013-07-03 LAB — FERRITIN: Ferritin: 101.7 ng/mL (ref 10.0–291.0)

## 2013-07-03 LAB — IBC PANEL
IRON: 79 ug/dL (ref 42–145)
Saturation Ratios: 22.5 % (ref 20.0–50.0)
Transferrin: 251.2 mg/dL (ref 212.0–360.0)

## 2013-07-03 MED ORDER — MOVIPREP 100 G PO SOLR: 1.0000 | Freq: Once | ORAL | Status: DC

## 2013-07-03 NOTE — Progress Notes (Addendum)
07/03/2013 Lindsey Cuevas [id number] 15-Mar-1975   HISTORY OF PRESENT ILLNESS:  This is a pleasant 38 year old female who is new to our practice and was referred here by Dr. Deborra Medina.  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 She presents to our office today with complaints of rectal bleeding and some change in her bowel habits. She reports that she's had rectal bleeding in the past and underwent colonoscopy approximately 8 years ago in [place] at which time she reports that she had internal hemorrhoids. We do not have those records. On this occasion she started bleeding about 2 months ago and has been having bright red blood with every bowel movement both in the toilet and on the tissue. At first she thought this was secondary to her hemorrhoids, but it has been persistent with every bowel movement. She says that her bowels used to be quite regular with occasional diarrhea before her periods, but recently her stool has changed and has been thinner in caliber. She's also noticed a mucousy discharge with a foul "fishy" odor recently as well. She is concerned because her mother had colon cancer diagnosed at age 104. She also has a maternal uncle and a paternal uncle who also both had colon cancer. She has been using Anusol suppositories for the past couple of days that were given to her by her PCP and thinks that they have helped somewhat.  She was actually in the emergency department at [facility] earlier this week for kidney stones and had a CT scan of the abdomen and pelvis without contrast, which was unremarkable except for the renal stone. She has passed the stone and her abdominal pain has since resolved. She denies any other reports of abdominal discomfort.    Past Medical History  Diagnosis Date  . Thyroid disease   . Hashimoto's disease   . Thyroid nodule   . Fibromyalgia syndrome   . Depression   . Hemorrhoids   . Kidney stone    Past Surgical  History  Procedure Laterality Date  . Biopsy thyroid Right 11/25/2011    Neg  . Incision and drainage peritonsillar abscess Right 10/15/2011    Dr. Redmond Baseman  . Leep    . Breast enhancement surgery      reports that she has been smoking Cigarettes.  She has been smoking about 0.50 packs per day. She has never used smokeless tobacco. She reports that she does not drink alcohol or use illicit drugs. family history includes Asthma in her father; Cancer in her maternal grandmother; Colon cancer in her cousin, maternal uncle, and paternal uncle; Colon cancer (age of onset: 42) in her mother; Colon polyps in her brother and maternal uncle; Crohn's disease in her brother; Diabetes in her maternal grandmother; Heart disease in her paternal grandfather and paternal grandmother; Hyperlipidemia in her father; Hypertension in her father; Inflammatory bowel disease in her brother. There is no history of Kidney disease or Liver disease. Allergies  Allergen Reactions  . Percocet [Oxycodone-Acetaminophen] Nausea And Vomiting    Projectile vomiting.      Outpatient Encounter Prescriptions as of 07/03/2013  Medication Sig  . cetirizine (ZYRTEC) 10 MG tablet Take 10 mg by mouth at bedtime as needed for allergies.  Marland Kitchen levothyroxine (LEVOXYL) 137 MCG tablet Take 137 mcg by mouth daily before breakfast.  . Multiple Vitamin (MULTIVITAMIN) tablet Take 1 tablet by mouth daily.  . hydrocortisone (ANUSOL-HC) 25 MG suppository Place 25 mg rectally 2 (two) times daily as  needed.  . [DISCONTINUED] hydrocortisone (ANUSOL-HC) 25 MG suppository Place 1 suppository (25 mg total) rectally 2 (two) times daily.     REVIEW OF SYSTEMS  : All other systems reviewed and negative except where noted in the History of Present Illness.   PHYSICAL EXAM: BP 122/80  Pulse 80  Ht 5\' 7"  (1.702 m)  Wt 187 lb 9.6 oz (85.095 kg)  BMI 29.38 kg/m2  LMP 07/03/2013 General: Well developed white female in no acute distress Head: Normocephalic  and atraumatic Eyes:  Sclerae anicteric, conjunctiva pink. Ears: Normal auditory acuity. Lungs: Clear throughout to auscultation Heart: Regular rate and rhythm Abdomen: Soft, non-distended.  Normal bowel sounds.  Non-tender. Rectal:  Will be done at the time of colonoscopy next week. Musculoskeletal: Symmetrical with no gross deformities  Skin: No lesions on visible extremities Extremities: No edema  Neurological: Alert oriented x 4, grossly non-focal Psychological:  Alert and cooperative. Normal mood and affect  ASSESSMENT AND PLAN: -Rectal bleeding with change in bowel habits and family history of colon cancer in her mother.  Will schedule colonoscopy (for next week).  She can continue to use the anusol suppositories in the meantime along with daily stool softeners or Miralax.  Will check CBC as well as iron studies at her request.  Addendum: Reviewed and agree with initial management. Jerene Bears, MD

## 2013-07-03 NOTE — Patient Instructions (Signed)
You have been scheduled for a colonoscopy with propofol. Please follow written instructions given to you at your visit today.  Please pick up your prep kit at the pharmacy within the next 1-3 days. If you use inhalers (even only as needed), please bring them with you on the day of your procedure. Your physician has requested that you go to www.startemmi.com and enter the access code given to you at your visit today. This web site gives a general overview about your procedure. However, you should still follow specific instructions given to you by our office regarding your preparation for the procedure.  Your physician has requested that you go to the basement for the following lab work before leaving today: CBC Ferritin  IBC

## 2013-07-07 ENCOUNTER — Encounter: Payer: Self-pay | Admitting: Internal Medicine

## 2013-07-07 ENCOUNTER — Ambulatory Visit (AMBULATORY_SURGERY_CENTER): Payer: 59 | Admitting: Internal Medicine

## 2013-07-07 VITALS — BP 98/53 | HR 61 | Temp 98.0°F | Resp 18 | Ht 67.0 in | Wt 187.0 lb

## 2013-07-07 DIAGNOSIS — D126 Benign neoplasm of colon, unspecified: Secondary | ICD-10-CM

## 2013-07-07 DIAGNOSIS — R194 Change in bowel habit: Secondary | ICD-10-CM

## 2013-07-07 DIAGNOSIS — Z800 Family history of malignant neoplasm of digestive organs: Secondary | ICD-10-CM

## 2013-07-07 DIAGNOSIS — R198 Other specified symptoms and signs involving the digestive system and abdomen: Secondary | ICD-10-CM

## 2013-07-07 DIAGNOSIS — K625 Hemorrhage of anus and rectum: Secondary | ICD-10-CM

## 2013-07-07 MED ORDER — SODIUM CHLORIDE 0.9 % IV SOLN: 500.0000 mL | INTRAVENOUS | Status: DC

## 2013-07-07 NOTE — Progress Notes (Signed)
Called to room to assist during endoscopic procedure.  Patient ID and intended procedure confirmed with present staff. Received instructions for my participation in the procedure from the performing physician.  

## 2013-07-07 NOTE — Progress Notes (Signed)
A/ox3 pleased with MAC, report to April RN 

## 2013-07-07 NOTE — Progress Notes (Signed)
IV fluids wide open.

## 2013-07-07 NOTE — Patient Instructions (Signed)
YOU HAD AN ENDOSCOPIC PROCEDURE TODAY AT THE [facility]: Refer to the procedure report that was given to you for any specific questions about what was found during the examination.  If the procedure report does not answer your questions, please call your gastroenterologist to clarify.  If you requested that your care partner not be given the details of your procedure findings, then the procedure report has been included in a sealed envelope for you to review at your convenience later.  YOU SHOULD EXPECT: Some feelings of bloating in the abdomen. Passage of more gas than usual.  Walking can help get rid of the air that was put into your GI tract during the procedure and reduce the bloating. If you had a lower endoscopy (such as a colonoscopy or flexible sigmoidoscopy) you may notice spotting of blood in your stool or on the toilet paper. If you underwent a bowel prep for your procedure, then you may not have a normal bowel movement for a few days.  DIET: Your first meal following the procedure should be a light meal and then it is ok to progress to your normal diet.  A half-sandwich or bowl of soup is an example of a good first meal.  Heavy or fried foods are harder to digest and may make you feel nauseous or bloated.  Likewise meals heavy in dairy and vegetables can cause extra gas to form and this can also increase the bloating.  Drink plenty of fluids but you should avoid alcoholic beverages for 24 hours.  ACTIVITY: Your care partner should take you home directly after the procedure.  You should plan to take it easy, moving slowly for the rest of the day.  You can resume normal activity the day after the procedure however you should NOT DRIVE or use heavy machinery for 24 hours (because of the sedation medicines used during the test).    SYMPTOMS TO REPORT IMMEDIATELY: A gastroenterologist can be reached at any hour.  During normal business hours, 8:30 AM to 5:00 PM Monday through Friday,  call [phone].  After hours and on weekends, please call the GI answering service at [phone] who will take a message and have the physician on call contact you.   Following lower endoscopy (colonoscopy or flexible sigmoidoscopy):  Excessive amounts of blood in the stool  Significant tenderness or worsening of abdominal pains  Swelling of the abdomen that is new, acute  Fever of 100F or higher  Black, tarry-looking stools  FOLLOW UP: If any biopsies were taken you will be contacted by phone or by letter within the next 1-3 weeks.  Call your gastroenterologist if you have not heard about the biopsies in 3 weeks.  Our staff will call the home number listed on your records the next business day following your procedure to check on you and address any questions or concerns that you may have at that time regarding the information given to you following your procedure. This is a courtesy call and so if there is no answer at the home number and we have not heard from you through the emergency physician on call, we will assume that you have returned to your regular daily activities without incident.  SIGNATURES/CONFIDENTIALITY: You and/or your care partner have signed paperwork which will be entered into your electronic medical record.  These signatures attest to the fact that that the information above on your After Visit Summary has been reviewed and is understood.  Full responsibility of   the confidentiality of this discharge information lies with you and/or your care-partner.  Polyps-handout given  Complete 5 days of anusol hc suppository and continue daily miralax to avoid hard stool.  Office visit next available-office will call with appointment.

## 2013-07-07 NOTE — Op Note (Signed)
[place]  [address] [place], [id number]   COLONOSCOPY PROCEDURE REPORT  PATIENT: Lindsey Cuevas, Lindsey Cuevas  MR#: [id number] BIRTHDATE: [birth year] , 38  yrs. old GENDER: Female ENDOSCOPIST: Jerene Bears, MD REFERRED HT:DSKAJ Aron, M.D. PROCEDURE DATE:  07/07/2013 PROCEDURE:   Colonoscopy with cold biopsy polypectomy and Colonoscopy with snare polypectomy First Screening Colonoscopy - Avg.  risk and is 50 yrs.  old or older - No.  Prior Negative Screening - Now for repeat screening. N/A  History of Adenoma - Now for follow-up colonoscopy & has been > or = to 3 yrs.  N/A  Polyps Removed Today? Yes. ASA CLASS:   Class II INDICATIONS:Rectal Bleeding, elevated risk screening, Patient's immediate family history of colon cancer, and Patient's family history of colon cancer, distant relatives. MEDICATIONS: MAC sedation, administered by CRNA and propofol (Diprivan) 350mg  IV  DESCRIPTION OF PROCEDURE:   After the risks benefits and alternatives of the procedure were thoroughly explained, informed consent was obtained.  A digital rectal exam revealed several skin tags.   The LB [id number]  endoscope was introduced through the anus and advanced to the terminal ileum which was intubated for a short distance. No adverse events experienced.   The quality of the prep was good, using MoviPrep  The instrument was then slowly withdrawn as the colon was fully examined.   COLON FINDINGS: The mucosa appeared normal in the terminal ileum. Two sessile polyps ranging between 3-46mm in size were found in the proximal transverse colon and descending colon.  Polypectomy was performed with cold forceps and using cold snare.  All resections were complete and all polyp tissue was completely retrieved.   The colon mucosa was otherwise normal.  Retroflexed views revealed no abnormalities. The time to cecum=4 minutes 20 seconds.  Withdrawal time=11 minutes 40 seconds.  The scope was withdrawn  and the procedure completed. COMPLICATIONS: There were no complications.  ENDOSCOPIC IMPRESSION: 1.   Normal mucosa in the terminal ileum 2.   Two sessile polyps ranging between 3-23mm in size were found in the proximal transverse colon and descending colon; Polypectomy was performed with cold forceps and using cold snare 3.   The colon mucosa was otherwise normal  RECOMMENDATIONS: 1.  Await pathology results 2.  Complete 5 days of Anusol HC suppository and continue daily MiraLax to avoid hard stools 3.  Office visit next available   eSigned:  Jerene Bears, MD 07/07/2013 11:54 AM cc: The Patient and Arnette Norris MD

## 2013-07-08 ENCOUNTER — Telehealth: Payer: Self-pay

## 2013-07-08 NOTE — Telephone Encounter (Signed)
  Follow up Call-  Call back number 07/07/2013  Post procedure Call Back phone  # [phone]  Permission to leave phone message Yes     Patient questions:  Do you have a fever, pain , or abdominal swelling? no Pain Score  0 *  Have you tolerated food without any problems? yes  Have you been able to return to your normal activities? yes  Do you have any questions about your discharge instructions: Diet   no Medications  no Follow up visit  no  Do you have questions or concerns about your Care? no  Actions: * If pain score is 4 or above: No action needed, pain <4.

## 2013-07-13 ENCOUNTER — Encounter: Payer: Self-pay | Admitting: Internal Medicine

## 2013-07-22 ENCOUNTER — Telehealth: Payer: Self-pay | Admitting: Internal Medicine

## 2013-07-22 ENCOUNTER — Telehealth: Payer: Self-pay | Admitting: Family Medicine

## 2013-07-22 NOTE — Telephone Encounter (Signed)
Spoke to pt and advised per DrAron. Pt verbally expressed understanding and states she will contact GI for f/u

## 2013-07-22 NOTE — Telephone Encounter (Signed)
Patient was seen for rectal bleeding.  She was referred to GI and they did a Colonoscopy and there were polyps and they were removed.  Patient is still bleeding and it feels like something is stuck.  She,also,has a discharge that smells fishy. Patient is very uncomfortable. She's not sure what to do from here. Please advise. Patient can be reached at home until 2:00 and on cell phone after 2:00.

## 2013-07-22 NOTE — Telephone Encounter (Signed)
Patient reports that she is having continued rectal bleeding pain and "fishy" odor.  She completed the anusol suppositories as recommended at the time of the colonoscopy.  Please advise the next step

## 2013-07-22 NOTE — Telephone Encounter (Signed)
I would recommend that she call GI as soon as possible since it is rectal bleeding.  Please keep [place] updated.

## 2013-07-23 NOTE — Telephone Encounter (Signed)
Left message for patient to call back  

## 2013-07-23 NOTE — Telephone Encounter (Signed)
Would ensure that stools are soft with daily MiraLax No anorectal pathology was seen at the time of colonoscopy Would have her see advanced practitioner for endoscopy to rule out fissure, though no fissure was seen at the time of endoscopy She can begin daily probiotic with over-the-counter align

## 2013-07-27 NOTE — Telephone Encounter (Signed)
Patient notified She will come in and see Lindsey Cuevas RNP tomorrow at 2

## 2013-07-27 NOTE — Telephone Encounter (Signed)
Left message for patient to call back  

## 2013-07-28 ENCOUNTER — Ambulatory Visit (INDEPENDENT_AMBULATORY_CARE_PROVIDER_SITE_OTHER): Payer: 59 | Admitting: Nurse Practitioner

## 2013-07-28 ENCOUNTER — Encounter: Payer: Self-pay | Admitting: Nurse Practitioner

## 2013-07-28 VITALS — BP 100/60 | HR 66 | Ht 67.0 in | Wt 188.6 lb

## 2013-07-28 DIAGNOSIS — R198 Other specified symptoms and signs involving the digestive system and abdomen: Secondary | ICD-10-CM

## 2013-07-28 DIAGNOSIS — K625 Hemorrhage of anus and rectum: Secondary | ICD-10-CM

## 2013-07-28 NOTE — Patient Instructions (Signed)
Apply Neosporin to the affected area 2-3 times daily. Tye Savoy NP-C will call and check on you. If you need to report any worsening problems call right away, [phone] ask for a nurse, she can get the message to [place].

## 2013-07-29 ENCOUNTER — Encounter: Payer: Self-pay | Admitting: Nurse Practitioner

## 2013-07-29 DIAGNOSIS — R198 Other specified symptoms and signs involving the digestive system and abdomen: Secondary | ICD-10-CM | POA: Insufficient documentation

## 2013-07-29 NOTE — Progress Notes (Addendum)
     History of Present Illness:  Patient is a 38 year old female who recently established care here with Dr. Elmo Putt. She presented to our office late last month with rectal bleeding and bowel changes. She complained of a bowel rectal discharge as well. Patient subsequently underwent a colonoscopy with removal of 2 polyps. Other than polyps, the colonoscopy was normal.   Patient is back today for ongoing complaints of bowel rectal discharge and bleeding. She also complains of perianal discomfort. Stools have normalized since starting bowel regimen we prescribed for her.  Patient has become quite frustrated about the malodorous rectal discharge which is not feculent but just yellow liquid.   Current Medications, Allergies, Past Medical History, Past Surgical History, Family History and Social History were reviewed in [employer] record.   Physical Exam: General: Pleasant, well developed , white female in no acute distress Rectal: in left decubitus position between 6 and 7 o'clock there is a pinpoint skin tear ajacent to anus oozing small amount of blood. This is superficial based on exam with Qtip Area very tender. No evidence for perianal abscess or anal fissure Neurological: Alert oriented x 4, grossly nonfocal Psychological:  Alert and cooperative. Normal mood and affect  Assessment and Recommendations: 38 year old female with complaints of malodorous anal discharge, rectal bleeding/pain. She had a colonoscopy for evaluation of these symptoms (and [facility]) on 07/07/13 and there were no fissure, hemorrhoids, or other perianal findings.  Patient is very frustrated with ongoing symptoms. I do not appreciate any odors on exam today. The blood is likely coming from the small skin tear described above. Tear adjacent to anus so not an anal fissure. Suspect she is getting some perianal irritation from frequent anal discharge. Continue to keep perianal dry as possible, neosporin  to small tear.   Addendum: Reviewed and agree with management. Unclear source of bleeding. Tye Savoy got a skin tear near anus with oozing of blood which could be source. Tye Savoy will followup with patient to ensure improvement if not return office visit with me Jerene Bears, MD

## 2013-08-19 ENCOUNTER — Telehealth: Payer: Self-pay | Admitting: *Deleted

## 2013-08-19 ENCOUNTER — Ambulatory Visit (INDEPENDENT_AMBULATORY_CARE_PROVIDER_SITE_OTHER): Payer: 59 | Admitting: Nurse Practitioner

## 2013-08-19 ENCOUNTER — Telehealth: Payer: Self-pay | Admitting: Nurse Practitioner

## 2013-08-19 ENCOUNTER — Encounter: Payer: Self-pay | Admitting: Nurse Practitioner

## 2013-08-19 VITALS — BP 108/80 | HR 76 | Ht 67.0 in | Wt 189.8 lb

## 2013-08-19 DIAGNOSIS — K603 Anal fistula: Secondary | ICD-10-CM

## 2013-08-19 MED ORDER — CIPROFLOXACIN HCL 250 MG PO TABS: 250.0000 mg | ORAL_TABLET | Freq: Two times a day (BID) | ORAL | Status: DC

## 2013-08-19 MED ORDER — METRONIDAZOLE 250 MG PO TABS: 250.0000 mg | ORAL_TABLET | Freq: Two times a day (BID) | ORAL | Status: DC

## 2013-08-19 NOTE — Telephone Encounter (Signed)
Spoke with patient. She saw Tye Savoy, NP about a month ago for rectal pain and bleeding. She states Tye Savoy, NP told her she had a tear and to use Neosporin. She as been doing this. Now the pain is worse, still has bleeding and now has yellow drainage. She states she can see a "hole" in rectal area. Scheduled with Tye Savoy, NP today at 2:30 PM.

## 2013-08-19 NOTE — Telephone Encounter (Signed)
LM for Lindsey Cuevas at [place] for referral for consult to see Dr. Jonita Albee.  Advised they will be able to see [name] note and previous office visit of late. We are asking for an appointment for the patient to see Dr. Marcello Moores.

## 2013-08-19 NOTE — Patient Instructions (Signed)
We sent prescriptions to [facility]. 1. Cipro 250 mg  2. Metronidazole 250 mg  We have made a referral to [facility], Dr. Jonita Albee. They will call you with the appointment.

## 2013-08-20 ENCOUNTER — Encounter: Payer: Self-pay | Admitting: Nurse Practitioner

## 2013-08-20 DIAGNOSIS — K603 Anal fistula, unspecified: Secondary | ICD-10-CM | POA: Insufficient documentation

## 2013-08-20 NOTE — Progress Notes (Addendum)
     History of Present Illness:  Lindsey Cuevas is back for evaluation of rectal bleeding / drainage. She has identified a hole just inside anus which is draining blood and malodorous material.   Current Medications, Allergies, Past Medical History, Past Surgical History, Family History and Social History were reviewed in [employer] record.  Physical Exam: General: Pleasant, well developed , white female in no acute distress Rectal: Left lateral position:  Just inside anal verge (posterior midline) there is what appears to be a small hole draining small amount of bloody yellowish material. Mildly inflamed tissue surround area.   Extremities: No edema  Psychological:  Alert and cooperative. Normal mood and affect   Assessment and Recommendations:  38 year old female with several month history of malodorous rectal discharge with blood. This has been difficult to diagnose despite previous rectal exams and a colonoscopy. Today however a small hole ( ? fistula) was visualized just inside anal verge in posterior midline position. Area draining blood / yellowish material. This doesn't appear to be a fissure. Patient denies bowel changes and she had a normal colonoscopy a few months ago but still need to consider perianal Crohn's disease in a patient with a family history of IBD. We discussed pelvic MRI but I think referral to a colorectal surgeon is the next best step. I've arranged for her to see Dr. Marcello Moores at [facility]. In the meantime will treat patient with a course of Cipro and Flagyl   Addendum: Reviewed and agree with management.  I did see what is felt to be a fistula draining foul smelling, but thin yellow fluid.  Agree with Abx. No Crohn's seen at recent colonoscopy, but she does have a family hx.  Agree with surgical opinion  Jerene Bears, MD

## 2013-08-21 NOTE — Telephone Encounter (Signed)
Lindsey Cuevas called me back and made an appointment with Dr. Leighton Ruff for [phone] and spoke to the patient to advise.

## 2013-09-07 ENCOUNTER — Ambulatory Visit (INDEPENDENT_AMBULATORY_CARE_PROVIDER_SITE_OTHER): Payer: Commercial Managed Care - PPO | Admitting: General Surgery

## 2013-09-07 ENCOUNTER — Encounter (INDEPENDENT_AMBULATORY_CARE_PROVIDER_SITE_OTHER): Payer: Self-pay | Admitting: General Surgery

## 2013-09-07 VITALS — BP 110/68 | HR 64 | Temp 98.9°F | Resp 14 | Ht 67.0 in | Wt 188.8 lb

## 2013-09-07 DIAGNOSIS — K603 Anal fistula: Secondary | ICD-10-CM

## 2013-09-07 NOTE — Patient Instructions (Signed)

## 2013-09-07 NOTE — Progress Notes (Signed)
Chief Complaint  Patient presents with  . 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 female who presents to the office with bloody rectal drainage.  Other symptoms include pain and feeling of pressure.  This had been occurring for several weeks   It is intermittent in nature. It lighrned  her bowel habits are regular and her bowel movements are soft.  her fiber intake is dietary.  her last colonoscopy was 5/15 and normal except for 2 small polyps.  she denies any fecal leakage or loose stools.  She does not have personal h/o IBD, but she does have a family h/o IBD (brother).  she has had 0 anorectal procedures in the past.    Past Medical History  Diagnosis Date  . Thyroid disease   . Hashimoto's disease   . Thyroid nodule   . Fibromyalgia syndrome   . Depression   . Hemorrhoids   . Kidney stone       Past Surgical History  Procedure Laterality Date  . Biopsy thyroid Right 11/25/2011    Neg  . Incision and drainage peritonsillar abscess Right 10/15/2011    Dr. Redmond Baseman  . Leep    . Breast enhancement surgery          Current Outpatient Prescriptions  Medication Sig Dispense Refill  . cetirizine (ZYRTEC) 10 MG tablet Take 10 mg by mouth at bedtime as needed for allergies.      Mariane Baumgarten Sodium (COLACE PO) Take 1 capsule by mouth daily.      Marland Kitchen levothyroxine (LEVOXYL) 137 MCG tablet Take 137 mcg by mouth daily before breakfast.      . Multiple Vitamin (MULTIVITAMIN) tablet Take 1 tablet by mouth daily.       No current facility-administered medications for this visit.      Allergies  Allergen Reactions  . Percocet [Oxycodone-Acetaminophen] Nausea And Vomiting    Projectile vomiting.      Family History  Problem Relation Age of Onset  . Colon cancer Mother 27  . Asthma Father   . Hyperlipidemia Father   . Hypertension Father   . Inflammatory bowel disease Brother   . Cancer Maternal Grandmother     breast CA  . Colon cancer  Maternal Uncle     in his 3s  . Colon cancer Cousin   . Colon cancer Paternal Uncle   . Crohn's disease Brother   . Colon polyps Brother   . Colon polyps Maternal Uncle   . Diabetes Maternal Grandmother   . Heart disease Paternal Grandmother   . Heart disease Paternal Grandfather   . Kidney disease Neg Hx   . Liver disease Neg Hx     History   Social History  . Marital Status: Married    Spouse Name: N/A    Number of Children: 0  . Years of Education: N/A   Occupational History  . 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 Smoking status: Current Every Day Smoker -- 0.50 packs/day for 15 years    Types: Cigarettes  . Smokeless tobacco: Never Used     Comment: form given 07/03/13   . Alcohol Use: No  . Drug Use: No  . Sexual Activity: Not on file   Other Topics Concern  . Not on file   Social History Narrative  . No narrative on file      REVIEW  OF SYSTEMS - PERTINENT POSITIVES ONLY: Review of Systems - General ROS: negative for - chills, fever or weight loss Hematological and Lymphatic ROS: negative for - bleeding problems, blood clots or bruising Respiratory ROS: no cough, shortness of breath, or wheezing Cardiovascular ROS: no chest pain or dyspnea on exertion Gastrointestinal ROS: no abdominal pain, change in bowel habits, or black or bloody stools Genito-Urinary ROS: no dysuria, trouble voiding, or hematuria  EXAM: There were no vitals filed for this visit.  General appearance: alert and cooperative Resp: clear to auscultation bilaterally Cardio: regularly irregular rhythm GI: normal findings: soft, non-tender  Anal Exam Findings: small external opening in posterior midline, ?fistula with fissure    ASSESSMENT AND PLAN: Lindsey Cuevas is a 38 y.o. F with a draining perirectal lesion that improved with antibiotics.  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 I recommended an exam under anesthesia with fistulotomy versus seton placement. I do not  think that this will completely heal with antibiotics alone. Risk of the procedure or bleeding, slow wound healing and infection. If she were to have a fistulotomy, there is a small risk of incontinence. I believe she understands his risks and has agreed to proceed with surgery.    Rosario Adie, MD Colon and Rectal Surgery / [facility]      Visit Diagnoses: No diagnosis found.  Primary Care Physician: Arnette Norris, MD

## 2013-09-08 ENCOUNTER — Encounter (HOSPITAL_BASED_OUTPATIENT_CLINIC_OR_DEPARTMENT_OTHER): Payer: Self-pay | Admitting: *Deleted

## 2013-09-08 NOTE — Progress Notes (Signed)
NPO AFTER MN. ARRIVES AT 1030. NEEDS HG AND URINE PREG. WILL TAKE LEVOXYL AM DOS W/ SIPS OF WATER.

## 2013-09-14 ENCOUNTER — Ambulatory Visit (HOSPITAL_BASED_OUTPATIENT_CLINIC_OR_DEPARTMENT_OTHER): Payer: 59 | Admitting: Certified Registered"

## 2013-09-14 ENCOUNTER — Encounter (HOSPITAL_BASED_OUTPATIENT_CLINIC_OR_DEPARTMENT_OTHER)
Admission: RE | Disposition: A | Discharge status: Home / Self Care | Payer: Self-pay | Source: Ambulatory Visit | Attending: General Surgery

## 2013-09-14 ENCOUNTER — Encounter (HOSPITAL_BASED_OUTPATIENT_CLINIC_OR_DEPARTMENT_OTHER): Payer: Self-pay

## 2013-09-14 ENCOUNTER — Ambulatory Visit (HOSPITAL_BASED_OUTPATIENT_CLINIC_OR_DEPARTMENT_OTHER)
Admission: RE | Admit: 2013-09-14 | Discharge: 2013-09-14 | Disposition: A | Discharge status: Home / Self Care | Payer: 59 | Source: Ambulatory Visit | Attending: General Surgery | Admitting: General Surgery

## 2013-09-14 ENCOUNTER — Encounter (HOSPITAL_BASED_OUTPATIENT_CLINIC_OR_DEPARTMENT_OTHER): Payer: 59 | Admitting: Certified Registered"

## 2013-09-14 DIAGNOSIS — IMO0001 Reserved for inherently not codable concepts without codable children: Secondary | ICD-10-CM | POA: Insufficient documentation

## 2013-09-14 DIAGNOSIS — F329 Major depressive disorder, single episode, unspecified: Secondary | ICD-10-CM | POA: Insufficient documentation

## 2013-09-14 DIAGNOSIS — K603 Anal fistula, unspecified: Secondary | ICD-10-CM | POA: Insufficient documentation

## 2013-09-14 DIAGNOSIS — K648 Other hemorrhoids: Secondary | ICD-10-CM | POA: Insufficient documentation

## 2013-09-14 DIAGNOSIS — Z803 Family history of malignant neoplasm of breast: Secondary | ICD-10-CM | POA: Insufficient documentation

## 2013-09-14 DIAGNOSIS — E063 Autoimmune thyroiditis: Secondary | ICD-10-CM | POA: Insufficient documentation

## 2013-09-14 DIAGNOSIS — K644 Residual hemorrhoidal skin tags: Secondary | ICD-10-CM | POA: Insufficient documentation

## 2013-09-14 DIAGNOSIS — F17200 Nicotine dependence, unspecified, uncomplicated: Secondary | ICD-10-CM | POA: Insufficient documentation

## 2013-09-14 DIAGNOSIS — F3289 Other specified depressive episodes: Secondary | ICD-10-CM | POA: Insufficient documentation

## 2013-09-14 DIAGNOSIS — Z800 Family history of malignant neoplasm of digestive organs: Secondary | ICD-10-CM | POA: Insufficient documentation

## 2013-09-14 DIAGNOSIS — Z885 Allergy status to narcotic agent status: Secondary | ICD-10-CM | POA: Insufficient documentation

## 2013-09-14 HISTORY — DX: Personal history of urinary calculi: Z87.442

## 2013-09-14 HISTORY — DX: Gastro-esophageal reflux disease without esophagitis: K21.9

## 2013-09-14 HISTORY — PX: ANAL FISTULOTOMY: SHX6423

## 2013-09-14 HISTORY — DX: Fibromyalgia: M79.7

## 2013-09-14 HISTORY — DX: Anal fistula, unspecified: K60.30

## 2013-09-14 HISTORY — DX: Anal fistula: K60.3

## 2013-09-14 LAB — POCT HEMOGLOBIN-HEMACUE: Hemoglobin: 15.1 g/dL — ABNORMAL HIGH (ref 12.0–15.0)

## 2013-09-14 LAB — POCT PREGNANCY, URINE: PREG TEST UR: NEGATIVE

## 2013-09-14 SURGERY — ANAL FISTULOTOMY
Anesthesia: Monitor Anesthesia Care | Site: Rectum

## 2013-09-14 MED ORDER — SODIUM CHLORIDE 0.9 % IV SOLN
250.0000 mg | INTRAVENOUS | Status: DC | PRN
  Administered 2013-09-14: 10 ug/kg/min via INTRAVENOUS

## 2013-09-14 MED ORDER — FENTANYL CITRATE 0.05 MG/ML IJ SOLN
25.0000 ug | INTRAMUSCULAR | Status: DC | PRN
  Filled 2013-09-14: qty 1

## 2013-09-14 MED ORDER — ACETAMINOPHEN 325 MG PO TABS
650.0000 mg | ORAL_TABLET | ORAL | Status: DC | PRN
  Filled 2013-09-14: qty 2

## 2013-09-14 MED ORDER — SODIUM CHLORIDE 0.9 % IV SOLN
250.0000 mL | INTRAVENOUS | Status: DC | PRN
  Filled 2013-09-14: qty 250

## 2013-09-14 MED ORDER — ACETAMINOPHEN 650 MG RE SUPP
650.0000 mg | RECTAL | Status: DC | PRN
  Filled 2013-09-14: qty 1

## 2013-09-14 MED ORDER — BUPIVACAINE-EPINEPHRINE 0.5% -1:200000 IJ SOLN
INTRAMUSCULAR | Status: DC | PRN
  Administered 2013-09-14: 30 mL

## 2013-09-14 MED ORDER — SODIUM CHLORIDE 0.9 % IR SOLN
Status: DC | PRN
  Administered 2013-09-14: 500 mL

## 2013-09-14 MED ORDER — MIDAZOLAM HCL 5 MG/5ML IJ SOLN
INTRAMUSCULAR | Status: DC | PRN
  Administered 2013-09-14: 2 mg via INTRAVENOUS

## 2013-09-14 MED ORDER — PROPOFOL 10 MG/ML IV EMUL
INTRAVENOUS | Status: DC | PRN
  Administered 2013-09-14: 140 ug/kg/min via INTRAVENOUS

## 2013-09-14 MED ORDER — LACTATED RINGERS IV SOLN
INTRAVENOUS | Status: DC
Start: 2013-09-14 — End: 2013-09-14
  Administered 2013-09-14: 11:00:00 via INTRAVENOUS
  Filled 2013-09-14: qty 1000

## 2013-09-14 MED ORDER — HYDROCODONE-ACETAMINOPHEN 5-325 MG PO TABS: 1.0000 | ORAL_TABLET | ORAL | Status: DC | PRN

## 2013-09-14 MED ORDER — KETAMINE HCL 50 MG/ML IJ SOLN
INTRAMUSCULAR | Status: AC
  Filled 2013-09-14: qty 10

## 2013-09-14 MED ORDER — MIDAZOLAM HCL 2 MG/2ML IJ SOLN
INTRAMUSCULAR | Status: AC
  Filled 2013-09-14: qty 2

## 2013-09-14 MED ORDER — FENTANYL CITRATE 0.05 MG/ML IJ SOLN
INTRAMUSCULAR | Status: DC | PRN
  Administered 2013-09-14: 25 ug via INTRAVENOUS
  Administered 2013-09-14: 50 ug via INTRAVENOUS

## 2013-09-14 MED ORDER — LIDOCAINE 5 % EX OINT
TOPICAL_OINTMENT | CUTANEOUS | Status: DC | PRN
  Administered 2013-09-14: 1 via TOPICAL

## 2013-09-14 MED ORDER — SODIUM CHLORIDE 0.9 % IJ SOLN
3.0000 mL | INTRAMUSCULAR | Status: DC | PRN
  Filled 2013-09-14: qty 3

## 2013-09-14 MED ORDER — FENTANYL CITRATE 0.05 MG/ML IJ SOLN
INTRAMUSCULAR | Status: AC
  Filled 2013-09-14: qty 4

## 2013-09-14 MED ORDER — SODIUM CHLORIDE 0.9 % IJ SOLN
3.0000 mL | Freq: Two times a day (BID) | INTRAMUSCULAR | Status: DC
  Filled 2013-09-14: qty 3

## 2013-09-14 MED ORDER — LACTATED RINGERS IV SOLN
INTRAVENOUS | Status: DC
  Filled 2013-09-14: qty 1000

## 2013-09-14 SURGICAL SUPPLY — 37 items
BLADE HEX COATED 2.75 (ELECTRODE) ×3 IMPLANT
BLADE SURG 15 STRL LF DISP TIS (BLADE) ×1 IMPLANT
BLADE SURG 15 STRL SS (BLADE) ×2
BRIEF STRETCH FOR OB PAD LRG (UNDERPADS AND DIAPERS) ×3 IMPLANT
CANISTER SUCTION 2500CC (MISCELLANEOUS) ×3 IMPLANT
COVER MAYO STAND STRL (DRAPES) ×3 IMPLANT
COVER TABLE BACK 60X90 (DRAPES) ×3 IMPLANT
DRAPE PED LAPAROTOMY (DRAPES) ×3 IMPLANT
DRSG PAD ABDOMINAL 8X10 ST (GAUZE/BANDAGES/DRESSINGS) ×3 IMPLANT
ELECT REM PT RETURN 9FT ADLT (ELECTROSURGICAL) ×3
ELECTRODE REM PT RTRN 9FT ADLT (ELECTROSURGICAL) ×1 IMPLANT
GAUZE VASELINE 3X9 (GAUZE/BANDAGES/DRESSINGS) IMPLANT
GLOVE BIO SURGEON STRL SZ 6.5 (GLOVE) ×4 IMPLANT
GLOVE BIO SURGEONS STRL SZ 6.5 (GLOVE) ×2
GLOVE BIOGEL PI IND STRL 7.0 (GLOVE) ×1 IMPLANT
GLOVE BIOGEL PI IND STRL 7.5 (GLOVE) ×1 IMPLANT
GLOVE BIOGEL PI INDICATOR 7.0 (GLOVE) ×2
GLOVE BIOGEL PI INDICATOR 7.5 (GLOVE) ×2
GLOVE INDICATOR 7.5 STRL GRN (GLOVE) ×3 IMPLANT
GOWN STRL REUS W/ TWL XL LVL3 (GOWN DISPOSABLE) ×1 IMPLANT
GOWN STRL REUS W/TWL 2XL LVL3 (GOWN DISPOSABLE) ×3 IMPLANT
GOWN STRL REUS W/TWL XL LVL3 (GOWN DISPOSABLE) ×2
HYDROGEN PEROXIDE 16OZ (MISCELLANEOUS) IMPLANT
LOOP VESSEL MAXI BLUE (MISCELLANEOUS) IMPLANT
NEEDLE HYPO 25X1 1.5 SAFETY (NEEDLE) ×3 IMPLANT
NS IRRIG 500ML POUR BTL (IV SOLUTION) ×6 IMPLANT
PACK BASIN DAY SURGERY FS (CUSTOM PROCEDURE TRAY) ×3 IMPLANT
PAD ABD 8X10 STRL (GAUZE/BANDAGES/DRESSINGS) IMPLANT
PENCIL BUTTON HOLSTER BLD 10FT (ELECTRODE) ×3 IMPLANT
SPONGE GAUZE 4X4 12PLY STER LF (GAUZE/BANDAGES/DRESSINGS) ×3 IMPLANT
SUT CHROMIC 3 0 SH 27 (SUTURE) ×3 IMPLANT
SYR CONTROL 10ML LL (SYRINGE) ×3 IMPLANT
TOWEL OR 17X24 6PK STRL BLUE (TOWEL DISPOSABLE) ×6 IMPLANT
TRAY DSU PREP LF (CUSTOM PROCEDURE TRAY) ×3 IMPLANT
TUBE CONNECTING 12'X1/4 (SUCTIONS) ×1
TUBE CONNECTING 12X1/4 (SUCTIONS) ×2 IMPLANT
YANKAUER SUCT BULB TIP NO VENT (SUCTIONS) ×3 IMPLANT

## 2013-09-14 NOTE — Discharge Instructions (Addendum)
ANORECTAL SURGERY: POST OP INSTRUCTIONS °1. Take your usually prescribed home medications unless otherwise directed. °2. DIET: During the first few hours after surgery sip on some liquids until you are able to urinate.  It is normal to not urinate for several hours after this surgery.  If you feel uncomfortable, please contact the office for instructions.  After you are able to urinate,you may eat, if you feel like it.  Follow a light bland diet the first 24 hours after arrival home, such as soup, liquids, crackers, etc.  Be sure to include lots of fluids daily (6-8 glasses).  Avoid fast food or heavy meals, as your are more likely to get nauseated.  Eat a low fat diet the next few days after surgery.  Limit caffeine intake to 1-2 servings a day. °3. PAIN CONTROL: °a. Pain is best controlled by a usual combination of several different methods TOGETHER: °i. Muscle relaxation: Soak in a warm bath (or Sitz bath) three times a day and after bowel movements.  Continue to do this until all pain is resolved. °ii. Over the counter pain medication °iii. Prescription pain medication °b. Most patients will experience some swelling and discomfort in the anus/rectal area and incisions.  Heat such as warm towels, sitz baths, warm baths, etc to help relax tight/sore spots and speed recovery.  Some people prefer to use ice, especially in the first couple days after surgery, as it may decrease the pain and swelling, or alternate between ice & heat.  Experiment to what works for you.  Swelling and bruising can take several weeks to resolve.  Pain can take even longer to completely resolve. °c. It is helpful to take an over-the-counter pain medication regularly for the first few weeks.  Choose one of the following that works best for you: °i. Naproxen (Aleve, etc)  Two 220mg tabs twice a day °ii. Ibuprofen (Advil, etc) Three 200mg tabs four times a day (every meal & bedtime) °d. A  prescription for pain medication (such as percocet,  oxycodone, hydrocodone, etc) should be given to you upon discharge.  Take your pain medication as prescribed.  °i. If you are having problems/concerns with the prescription medicine (does not control pain, nausea, vomiting, rash, itching, etc), please call us [phone] to see if we need to switch you to a different pain medicine that will work better for you and/or control your side effect better. °ii. If you need a refill on your pain medication, please contact your pharmacy.  They will contact our office to request authorization. Prescriptions will not be filled after 5 pm or on week-ends. °4. KEEP YOUR BOWELS REGULAR and AVOID CONSTIPATION °a. The goal is one to two soft bowel movements a day.  You should at least have a bowel movement every other day. °b. Avoid getting constipated.  Between the surgery and the pain medications, it is common to experience some constipation. This can be very painful after rectal surgery.  Increasing fluid intake and taking a fiber supplement (such as Metamucil, Citrucel, FiberCon, etc) 1-2 times a day regularly will usually help prevent this problem from occurring.  A stool softener like colace is also recommended.  This can be purchased over the counter at your pharmacy.  You can take it up to 3 times a day.  If you do not have a bowel movement after 24 hrs since your surgery, take one does of milk of magnesia.  If you still haven't had a bowel movement 8-12 hours after   that dose, take another dose.  If you don't have a bowel movement 48 hrs after surgery, purchase a Fleets enema from the drug store and administer gently per package instructions.  If you still are having trouble with your bowel movements after that, please call the office for further instructions. °c. If you develop diarrhea or have many loose bowel movements, simplify your diet to bland foods & liquids for a few days.  Stop any stool softeners and decrease your fiber supplement.  Switching to mild  anti-diarrheal medications (Kayopectate, Pepto Bismol) can help.  If this worsens or does not improve, please call us. ° °5. Wound Care °a. Remove your bandages before your first bowel movement or 8 hours after surgery.     °b. Remove any wound packing material at this tim,e as well.  You do not need to repack the wound unless instructed otherwise.  Wear an absorbent pad or soft cotton gauze in your underwear to catch any drainage and help keep the area clean. You should change this every 2-3 hours while awake. °c. Keep the area clean and dry.  Bathe / shower every day, especially after bowel movements.  Keep the area clean by showering / bathing over the incision / wound.   It is okay to soak an open wound to help wash it.  Wet wipes or showers / gentle washing after bowel movements is often less traumatic than regular toilet paper. °d. You may have some styrofoam-like soft packing in the rectum which will come out with the first bowel movement.  °e. You will often notice bleeding with bowel movements.  This should slow down by the end of the first week of surgery °f. Expect some drainage.  This should slow down, too, by the end of the first week of surgery.  Wear an absorbent pad or soft cotton gauze in your underwear until the drainage stops. °g. Do Not sit on a rubber or pillow ring.  This can make you symptoms worse.  You may sit on a soft pillow if needed.  °6. ACTIVITIES as tolerated:   °a. You may resume regular (light) daily activities beginning the next day--such as daily self-care, walking, climbing stairs--gradually increasing activities as tolerated.  If you can walk 30 minutes without difficulty, it is safe to try more intense activity such as jogging, treadmill, bicycling, low-impact aerobics, swimming, etc. °b. Save the most intensive and strenuous activity for last such as sit-ups, heavy lifting, contact sports, etc  Refrain from any heavy lifting or straining until you are off narcotics for pain  control.   °c. You may drive when you are no longer taking prescription pain medication, you can comfortably sit for long periods of time, and you can safely maneuver your car and apply brakes. °d. You may have sexual intercourse when it is comfortable.  °7. FOLLOW UP in our office °a. Please call [facility] at [phone] to set up an appointment to see your surgeon in the office for a follow-up appointment approximately 3-4 weeks after your surgery. °b. Make sure that you call for this appointment the day you arrive home to insure a convenient appointment time. °10. IF YOU HAVE DISABILITY OR FAMILY LEAVE FORMS, BRING THEM TO THE OFFICE FOR PROCESSING.  DO NOT GIVE THEM TO YOUR DOCTOR. ° ° ° ° °WHEN TO CALL US [phone]: °1. Poor pain control °2. Reactions / problems with new medications (rash/itching, nausea, etc)  °3. Fever over 101.5 F (38.5 C) °4.   Inability to urinate °5. Nausea and/or vomiting °6. Worsening swelling or bruising °7. Continued bleeding from incision. °8. Increased pain, redness, or drainage from the incision ° °The clinic staff is available to answer your questions during regular business hours (8:30am-5pm).  Please don’t hesitate to call and ask to speak to one of our nurses for clinical concerns.   A surgeon from [facility] is always on call at the hospitals °  °If you have a medical emergency, go to the nearest emergency room or call 911. °  ° °[facility] °[address], [address], [place], [place]  [address] ? °MAIN: [phone] ? TOLL FREE: [phone] ? °FAX [phone] °[email or web address] ° ° ° ° ° °Post Anesthesia Home Care Instructions ° °Activity: °Get plenty of rest for the remainder of the day. A responsible adult should stay with you for 24 hours following the procedure.  °For the next 24 hours, DO NOT: °-Drive a car °-Operate machinery °-Drink alcoholic beverages °-Take any medication unless instructed by your  physician °-Make any legal decisions or sign important papers. ° °Meals: °Start with liquid foods such as gelatin or soup. Progress to regular foods as tolerated. Avoid greasy, spicy, heavy foods. If nausea and/or vomiting occur, drink only clear liquids until the nausea and/or vomiting subsides. Call your physician if vomiting continues. ° °Special Instructions/Symptoms: °Your throat may feel dry or sore from the anesthesia or the breathing tube placed in your throat during surgery. If this causes discomfort, gargle with warm salt water. The discomfort should disappear within 24 hours. ° °

## 2013-09-14 NOTE — Op Note (Signed)
09/14/2013  12:29 PM  PATIENT:  Lindsey Cuevas  38 y.o. female  Patient Care Team: Lucille Passy, MD as PCP - General (Family Medicine)  PRE-OPERATIVE DIAGNOSIS:  anal fistula  POST-OPERATIVE DIAGNOSIS:  anal fistula  PROCEDURE:  ANAL EUA, FISTULOTOMY  SURGEON:  Surgeon(s): Leighton Ruff, MD  ASSISTANT: none   ANESTHESIA:   local and MAC  SPECIMEN:  No Specimen  DISPOSITION OF SPECIMEN:  N/A  COUNTS:  YES  PLAN OF CARE: Discharge to home after PACU  PATIENT DISPOSITION:  PACU - hemodynamically stable.  INDICATION: This is a 38 y.o. F with signs and symptoms consistent with anal fistula   OR FINDINGS: shallow posterior midline fisutla  DESCRIPTION: the patient was identified in the preoperative holding area and taken to the OR where they were laid on the operating room table.  MAC anesthesia was induced without difficulty. The patient was then positioned in prone jackknife position with buttocks gently taped apart.  The patient was then prepped and draped in usual sterile fashion.  SCDs were noted to be in place prior to the initiation of anesthesia. A surgical timeout was performed indicating the correct patient, procedure, positioning and need for preoperative antibiotics.  A rectal block was performed using Marcaine with epi.  I began with a digital rectal exam.  There were no masses.  I then placed a Hill-Ferguson anoscope into the anal canal and evaluated this completely.  The patient had small internal hemorrhoids and a small external skin tag in the right posterior perianal area.  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  An S shaped fistula probe was inserted and a fistulotomy was performed.  There was no muscle involvement.  The edges were marsupialized using a 3-0 chromic suture.  Lidocaine ointment and a dressing were applied.  Patient was awakened and sent to PACU instable condition.  All counts were correct per OR staff.

## 2013-09-14 NOTE — Transfer of Care (Signed)
Immediate Anesthesia Transfer of Care Note  Patient: Lindsey Cuevas  Procedure(s) Performed: Procedure(s) (LRB): ANAL EUA, FISTULOTOMY (N/A)  Patient Location: PACU  Anesthesia Type: General  Level of Consciousness: awake, oriented, sedated and patient cooperative  Airway & Oxygen Therapy: Patient Spontanous Breathing and Patient connected to face mask oxygen  Post-op Assessment: Report given to PACU RN and Post -op Vital signs reviewed and stable  Post vital signs: Reviewed and stable  Complications: No apparent anesthesia complications

## 2013-09-14 NOTE — Anesthesia Postprocedure Evaluation (Signed)
  Anesthesia Post-op Note  Patient: Lindsey Cuevas  Procedure(s) Performed: Procedure(s) (LRB): ANAL EUA, FISTULOTOMY (N/A)  Patient Location: PACU  Anesthesia Type: MAC  Level of Consciousness: awake and alert   Airway and Oxygen Therapy: Patient Spontanous Breathing  Post-op Pain: mild  Post-op Assessment: Post-op Vital signs reviewed, Patient's Cardiovascular Status Stable, Respiratory Function Stable, Patent Airway and No signs of Nausea or vomiting  Last Vitals:  Filed Vitals:   09/14/13 1317  BP: 116/68  Pulse: 68  Temp:   Resp: 19    Post-op Vital Signs: stable   Complications: No apparent anesthesia complications

## 2013-09-14 NOTE — H&P (View-Only) (Signed)
Chief Complaint  Patient presents with  . 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 female who presents to the office with bloody rectal drainage.  Other symptoms include pain and feeling of pressure.  This had been occurring for several weeks   It is intermittent in nature. It lighrned  her bowel habits are regular and her bowel movements are soft.  her fiber intake is dietary.  her last colonoscopy was 5/15 and normal except for 2 small polyps.  she denies any fecal leakage or loose stools.  She does not have personal h/o IBD, but she does have a family h/o IBD (brother).  she has had 0 anorectal procedures in the past.    Past Medical History  Diagnosis Date  . Thyroid disease   . Hashimoto's disease   . Thyroid nodule   . Fibromyalgia syndrome   . Depression   . Hemorrhoids   . Kidney stone       Past Surgical History  Procedure Laterality Date  . Biopsy thyroid Right 11/25/2011    Neg  . Incision and drainage peritonsillar abscess Right 10/15/2011    Dr. Redmond Baseman  . [place]    . Breast enhancement surgery          Current Outpatient Prescriptions  Medication Sig Dispense Refill  . cetirizine (ZYRTEC) 10 MG tablet Take 10 mg by mouth at bedtime as needed for allergies.      Mariane Baumgarten Sodium (COLACE PO) Take 1 capsule by mouth daily.      Marland Kitchen levothyroxine (LEVOXYL) 137 MCG tablet Take 137 mcg by mouth daily before breakfast.      . Multiple Vitamin (MULTIVITAMIN) tablet Take 1 tablet by mouth daily.       No current facility-administered medications for this visit.      Allergies  Allergen Reactions  . Percocet [Oxycodone-Acetaminophen] Nausea And Vomiting    Projectile vomiting.      Family History  Problem Relation Age of Onset  . Colon cancer Mother 83  . Asthma Father   . Hyperlipidemia Father   . Hypertension Father   . Inflammatory bowel disease Brother   . Cancer Maternal Grandmother     breast CA  . Colon cancer  Maternal Uncle     in his 12s  . Colon cancer Cousin   . Colon cancer Paternal Uncle   . Crohn's disease Brother   . Colon polyps Brother   . Colon polyps Maternal Uncle   . Diabetes Maternal Grandmother   . Heart disease Paternal Grandmother   . Heart disease Paternal Grandfather   . Kidney disease Neg Hx   . Liver disease Neg Hx     History   Social History  . Marital Status: Married    Spouse Name: N/A    Number of Children: 0  . Years of Education: N/A   Occupational History  . 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 Smoking status: Current Every Day Smoker -- 0.50 packs/day for 15 years    Types: Cigarettes  . Smokeless tobacco: Never Used     Comment: form given 07/03/13   . Alcohol Use: No  . Drug Use: No  . Sexual Activity: Not on file   Other Topics Concern  . Not on file   Social History Narrative  . No narrative on file      REVIEW  OF SYSTEMS - PERTINENT POSITIVES ONLY: Review of Systems - General ROS: negative for - chills, fever or weight loss Hematological and Lymphatic ROS: negative for - bleeding problems, blood clots or bruising Respiratory ROS: no cough, shortness of breath, or wheezing Cardiovascular ROS: no chest pain or dyspnea on exertion Gastrointestinal ROS: no abdominal pain, change in bowel habits, or black or bloody stools Genito-Urinary ROS: no dysuria, trouble voiding, or hematuria  EXAM: There were no vitals filed for this visit.  General appearance: alert and cooperative Resp: clear to auscultation bilaterally Cardio: regularly irregular rhythm GI: normal findings: soft, non-tender  Anal Exam Findings: small external opening in posterior midline, ?fistula with fissure    ASSESSMENT AND PLAN: Lindsey Cuevas is a 38 y.o. F with a draining perirectal lesion that improved with antibiotics.  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 I recommended an exam under anesthesia with fistulotomy versus seton placement. I do not  think that this will completely heal with antibiotics alone. Risk of the procedure or bleeding, slow wound healing and infection. If she were to have a fistulotomy, there is a small risk of incontinence. I believe she understands his risks and has agreed to proceed with surgery.    Rosario Adie, MD Colon and Rectal Surgery / [facility]      Visit Diagnoses: No diagnosis found.  Primary Care Physician: Arnette Norris, MD

## 2013-09-14 NOTE — Interval H&P Note (Signed)
History and Physical Interval Note:  09/14/2013 11:46 AM  Lindsey Cuevas  has presented today for surgery, with the diagnosis of anal fistula  The various methods of treatment have been discussed with the patient and family. After consideration of risks, benefits and other options for treatment, the patient has consented to  Procedure(s): ANAL EUA, POSSIBLE FISTULOTOMY, POSSIBLE I & D, POSSIBLE SETON PLACEMENT (N/A) as a surgical intervention .  The patient's history has been reviewed, patient examined, no change in status, stable for surgery.  I have reviewed the patient's chart and labs.  Questions were answered to the patient's satisfaction.     Rosario Adie, MD  [facility]

## 2013-09-14 NOTE — Anesthesia Preprocedure Evaluation (Addendum)
Anesthesia Evaluation  Patient identified by MRN, date of birth, ID band Patient awake    Reviewed: Allergy & Precautions, H&P , NPO status , Patient's Chart, lab work & pertinent test results  Airway Mallampati: II TM Distance: >3 FB Neck ROM: full    Dental no notable dental hx. (+) Teeth Intact, Dental Advisory Given   Pulmonary neg pulmonary ROS, Current Smoker,  breath sounds clear to auscultation  Pulmonary exam normal       Cardiovascular Exercise Tolerance: Good negative cardio ROS  Rhythm:regular Rate:Normal     Neuro/Psych negative neurological ROS  negative psych ROS   GI/Hepatic negative GI ROS, Neg liver ROS,   Endo/Other  Hypothyroidism Hashimoto's disease  Renal/GU negative Renal ROS  negative genitourinary   Musculoskeletal   Abdominal   Peds  Hematology negative hematology ROS (+)   Anesthesia Other Findings   Reproductive/Obstetrics negative OB ROS                          Anesthesia Physical Anesthesia Plan  ASA: II  Anesthesia Plan: MAC   Post-op Pain Management:    Induction:   Airway Management Planned:   Additional Equipment:   Intra-op Plan:   Post-operative Plan:   Informed Consent: I have reviewed the patients History and Physical, chart, labs and discussed the procedure including the risks, benefits and alternatives for the proposed anesthesia with the patient or authorized representative who has indicated his/her understanding and acceptance.   Dental Advisory Given  Plan Discussed with: CRNA and Surgeon  Anesthesia Plan Comments:        Anesthesia Quick Evaluation

## 2013-09-16 ENCOUNTER — Encounter (HOSPITAL_BASED_OUTPATIENT_CLINIC_OR_DEPARTMENT_OTHER): Payer: Self-pay | Admitting: General Surgery

## 2013-09-28 ENCOUNTER — Ambulatory Visit (INDEPENDENT_AMBULATORY_CARE_PROVIDER_SITE_OTHER): Payer: Commercial Managed Care - PPO | Admitting: General Surgery

## 2013-09-28 VITALS — BP 108/62 | HR 89 | Temp 98.5°F | Ht 67.0 in | Wt 187.5 lb

## 2013-09-28 DIAGNOSIS — K603 Anal fistula: Secondary | ICD-10-CM

## 2013-09-28 NOTE — Progress Notes (Signed)
Lindsey Cuevas is a 38 y.o. female who is status post a anal fistulotomy on 09/14/13.  She is doing well. She is anxious to return to work. She is having regular bowel movements. She is using fiber supplements and stool softeners. Her pain is getting better. She is increasing her activity slowly.  Objective: Filed Vitals:   09/28/13 1151  BP: 108/62  Pulse: 89  Temp: 98.5 F (36.9 C)    General appearance: alert and cooperative GI: normal findings: soft, non-tender  Incision: healing well, Approximately 50% healed at this time.   Assessment: s/p  Patient Active Problem List   Diagnosis Date Noted  . 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 Rectal discharge 07/29/2013  . Change in bowel habits 07/03/2013  . Rectal bleeding 06/25/2013  . Family history of colon cancer 06/25/2013  . Falling hair 10/27/2012  . Other malaise and fatigue 10/27/2012  . Hashimoto's disease   . Depression   . Neuropathy 07/31/2011  . Hypothyroidism 07/31/2011    Plan: Doing well after fistulotomy. I believe this will continue to heal slowly given that it is at her posterior midline. I will see her back in the office as needed. It is okay for her to return to work on August 24. She will call the office if her symptoms have not resolved within the next 4-6 weeks.    Rosario Adie, [facility] [place]   09/28/2013 11:58 AM

## 2013-09-28 NOTE — Patient Instructions (Signed)
Keep area clean and dry.  Advance activity as tolerated. Okay to return to work next Monday. Call the office if her symptoms have not completely resolved within the next 4-6 weeks.

## 2014-01-15 ENCOUNTER — Encounter: Payer: Self-pay | Admitting: Physician Assistant

## 2014-01-15 ENCOUNTER — Ambulatory Visit (INDEPENDENT_AMBULATORY_CARE_PROVIDER_SITE_OTHER): Payer: 59 | Admitting: Physician Assistant

## 2014-01-15 VITALS — BP 135/84 | HR 91 | Temp 98.4°F | Wt 195.0 lb

## 2014-01-15 DIAGNOSIS — J029 Acute pharyngitis, unspecified: Secondary | ICD-10-CM | POA: Insufficient documentation

## 2014-01-15 DIAGNOSIS — H65192 Other acute nonsuppurative otitis media, left ear: Secondary | ICD-10-CM | POA: Insufficient documentation

## 2014-01-15 LAB — POCT RAPID STREP A (OFFICE): Rapid Strep A Screen: NEGATIVE

## 2014-01-15 MED ORDER — AMOXICILLIN 875 MG PO TABS: 875.0000 mg | ORAL_TABLET | Freq: Two times a day (BID) | ORAL | Status: DC

## 2014-01-15 NOTE — Assessment & Plan Note (Signed)
Rx Amoxicillin.  Increase fluids.  Continue other medications as prescribed.

## 2014-01-15 NOTE — Patient Instructions (Signed)
Please take antibiotic as directed with food until all tablets are gone.  Increase fluid intake.  Alternate between tylenol and ibuprofen for pain.  Salt-water gargles and a humidifier may also be beneficial.  Call or return to clinic if symptoms are not improving.  Otitis Media Otitis media is redness, soreness, and inflammation of the middle ear. Otitis media may be caused by allergies or, most commonly, by infection. Often it occurs as a complication of the common cold. SIGNS AND SYMPTOMS Symptoms of otitis media may include:  Earache.  Fever.  Ringing in your ear.  Headache.  Leakage of fluid from the ear. DIAGNOSIS To diagnose otitis media, your health care provider will examine your ear with an otoscope. This is an instrument that allows your health care provider to see into your ear in order to examine your eardrum. Your health care provider also will ask you questions about your symptoms. TREATMENT  Typically, otitis media resolves on its own within 3-5 days. Your health care provider may prescribe medicine to ease your symptoms of pain. If otitis media does not resolve within 5 days or is recurrent, your health care provider may prescribe antibiotic medicines if he or she suspects that a bacterial infection is the cause. HOME CARE INSTRUCTIONS   If you were prescribed an antibiotic medicine, finish it all even if you start to feel better.  Take medicines only as directed by your health care provider.  Keep all follow-up visits as directed by your health care provider. SEEK MEDICAL CARE IF:  You have otitis media only in one ear, or bleeding from your nose, or both.  You notice a lump on your neck.  You are not getting better in 3-5 days.  You feel worse instead of better. SEEK IMMEDIATE MEDICAL CARE IF:   You have pain that is not controlled with medicine.  You have swelling, redness, or pain around your ear or stiffness in your neck.  You notice that part of your  face is paralyzed.  You notice that the bone behind your ear (mastoid) is tender when you touch it. MAKE SURE YOU:   Understand these instructions.  Will watch your condition.  Will get help right away if you are not doing well or get worse. Document Released: 11/04/2003 Document Revised: 06/15/2013 Document Reviewed: 08/26/2012 [facility] Patient Information 2015 [place], [place]. This information is not intended to replace advice given to you by your health care provider. Make sure you discuss any questions you have with your health care provider.  Strep Throat Strep throat is an infection of the throat caused by a bacteria named Streptococcus pyogenes. Your health care provider may call the infection streptococcal "tonsillitis" or "pharyngitis" depending on whether there are signs of inflammation in the tonsils or back of the throat. Strep throat is most common in children aged 5-15 years during the cold months of the year, but it can occur in people of any age during any season. This infection is spread from person to person (contagious) through coughing, sneezing, or other close contact. SIGNS AND SYMPTOMS   Fever or chills.  Painful, swollen, red tonsils or throat.  Pain or difficulty when swallowing.  White or yellow spots on the tonsils or throat.  Swollen, tender lymph nodes or "glands" of the neck or under the jaw.  Red rash all over the body (rare). DIAGNOSIS  Many different infections can cause the same symptoms. A test must be done to confirm the diagnosis so the right treatment  can be given. A "rapid strep test" can help your health care provider make the diagnosis in a few minutes. If this test is not available, a light swab of the infected area can be used for a throat culture test. If a throat culture test is done, results are usually available in a day or two. TREATMENT  Strep throat is treated with antibiotic medicine. HOME CARE INSTRUCTIONS   Gargle with 1 tsp of  salt in 1 cup of warm water, 3-4 times per day or as needed for comfort.  Family members who also have a sore throat or fever should be tested for strep throat and treated with antibiotics if they have the strep infection.  Make sure everyone in your household washes their hands well.  Do not share food, drinking cups, or personal items that could cause the infection to spread to others.  You may need to eat a soft food diet until your sore throat gets better.  Drink enough water and fluids to keep your urine clear or pale yellow. This will help prevent dehydration.  Get plenty of rest.  Stay home from school, day care, or work until you have been on antibiotics for 24 hours.  Take medicines only as directed by your health care provider.  Take your antibiotic medicine as directed by your health care provider. Finish it even if you start to feel better. SEEK MEDICAL CARE IF:   The glands in your neck continue to enlarge.  You develop a rash, cough, or earache.  You cough up green, yellow-brown, or bloody sputum.  You have pain or discomfort not controlled by medicines.  Your problems seem to be getting worse rather than better.  You have a fever. SEEK IMMEDIATE MEDICAL CARE IF:   You develop any new symptoms such as vomiting, severe headache, stiff or painful neck, chest pain, shortness of breath, or trouble swallowing.  You develop severe throat pain, drooling, or changes in your voice.  You develop swelling of the neck, or the skin on the neck becomes red and tender.  You develop signs of dehydration, such as fatigue, dry mouth, and decreased urination.  You become increasingly sleepy, or you cannot wake up completely. MAKE SURE YOU:  Understand these instructions.  Will watch your condition.  Will get help right away if you are not doing well or get worse. Document Released: 01/27/2000 Document Revised: 06/15/2013 Document Reviewed: 03/30/2010 [facility] Patient  Information 2015 [place], [place]. This information is not intended to replace advice given to you by your health care provider. Make sure you discuss any questions you have with your health care provider.

## 2014-01-15 NOTE — Progress Notes (Signed)
Pre visit review using our clinic review tool, if applicable. No additional management support is needed unless otherwise documented below in the visit note. 

## 2014-01-15 NOTE — Progress Notes (Signed)
Patient presents to clinic today c/o sore throat with swollen tonsils and white patches for several days.  Endorses intermittent fevers, chills, left ear pain and fatigue.  Denies recent travel or sick contact.  Past Medical History  Diagnosis Date  . Hashimoto's disease   . Thyroid nodule   . Depression   . Hemorrhoids   . Chronic fatigue   . Fibromyalgia   . Perianal fistula   . Heart murmur     MILD AND PT ASYMPTOMATIC  . GERD (gastroesophageal reflux disease)     WATCH DIET  . History of kidney stones     Current Outpatient Prescriptions on File Prior to Visit  Medication Sig Dispense Refill  . cetirizine (ZYRTEC) 10 MG tablet Take 10 mg by mouth at bedtime as needed for allergies.    Mariane Baumgarten Sodium (COLACE PO) Take 1 capsule by mouth 2 (two) times daily as needed.     Marland Kitchen HYDROcodone-acetaminophen (NORCO/VICODIN) 5-325 MG per tablet Take 1 tablet by mouth every 4 (four) hours as needed. 30 tablet 0  . levothyroxine (LEVOXYL) 137 MCG tablet Take 137 mcg by mouth daily before breakfast.    . Multiple Vitamin (MULTIVITAMIN) tablet Take 1 tablet by mouth daily.     No current facility-administered medications on file prior to visit.    Allergies  Allergen Reactions  . Percocet [Oxycodone-Acetaminophen] Nausea And Vomiting    Family History  Problem Relation Age of Onset  . Colon cancer Mother 67  . Cancer Mother     colon  . Asthma Father   . Hyperlipidemia Father   . Hypertension Father   . Inflammatory bowel disease Brother   . Cancer Maternal Grandmother     breast CA  . Diabetes Maternal Grandmother   . Colon cancer Maternal Uncle     in his 13s  . Cancer Maternal Uncle     colon  . Colon cancer Cousin   . Colon cancer Paternal Uncle   . Cancer Paternal Uncle     colon  . Crohn's disease Brother   . Colon polyps Brother   . Colon polyps Maternal Uncle   . Heart disease Paternal Grandmother   . Heart disease Paternal Grandfather   . Kidney disease  Neg Hx   . Liver disease Neg Hx   . Cancer Paternal Uncle     lung    History   Social History  . Marital Status: Married    Spouse Name: N/A    Number of Children: 0  . Years of Education: N/A   Occupational History  . 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 Smoking status: Current Every Day Smoker -- 0.25 packs/day for 15 years    Types: Cigarettes  . Smokeless tobacco: Never Used     Comment: PT CURRENTLY TRYING TO QUIT  . Alcohol Use: No  . Drug Use: No  . Sexual Activity: None   Other Topics Concern  . None   Social History Narrative   Review of Systems - See HPI.  All other ROS are negative.  BP 135/84 mmHg  Pulse 91  Temp(Src) 98.4 F (36.9 C)  Wt 195 lb (88.451 kg)  SpO2 99%  Physical Exam  Constitutional: She is oriented to person, place, and time and well-developed, well-nourished, and in no distress.  HENT:  Head: Normocephalic and atraumatic.  Right Ear: Tympanic membrane, external ear and ear canal normal.  Left Ear:  External ear and ear canal normal.  Nose: Nose normal.  Mouth/Throat: Uvula is midline and mucous membranes are normal. Oropharyngeal exudate and posterior oropharyngeal erythema present. No posterior oropharyngeal edema or tonsillar abscesses.  Eyes: Conjunctivae are normal.  Cardiovascular: Normal rate, regular rhythm, normal heart sounds and intact distal pulses.   Pulmonary/Chest: Effort normal and breath sounds normal. No respiratory distress. She has no wheezes. She has no rales. She exhibits no tenderness.  Neurological: She is alert and oriented to person, place, and time.  Skin: Skin is warm and dry. No rash noted.  Psychiatric: Affect normal.  Vitals reviewed.  Assessment/Plan: Acute nonsuppurative otitis media of left ear Rx Amoxicillin.  Increase fluids.  Continue other medications as prescribed.  Sore throat Rapid strep negative.  However tonisllar adenopathy with exudates.  Will send for  culture.  Patient being treated with Amoxicillin for AOM which will cover strep if present.  Increase fluids.  Rest.  Humidifier in bedroom. Alternate tylenol and ibuprofen for throat pain.  Return precautions discussed with patient.

## 2014-01-15 NOTE — Assessment & Plan Note (Signed)
Rapid strep negative.  However tonisllar adenopathy with exudates.  Will send for culture.  Patient being treated with Amoxicillin for AOM which will cover strep if present.  Increase fluids.  Rest.  Humidifier in bedroom. Alternate tylenol and ibuprofen for throat pain.  Return precautions discussed with patient.

## 2014-01-18 ENCOUNTER — Telehealth: Payer: Self-pay | Admitting: Family Medicine

## 2014-01-18 NOTE — Telephone Encounter (Signed)
emmi emailed °

## 2014-02-18 ENCOUNTER — Other Ambulatory Visit: Payer: Self-pay | Admitting: Internal Medicine

## 2014-02-18 DIAGNOSIS — E042 Nontoxic multinodular goiter: Secondary | ICD-10-CM

## 2014-02-19 ENCOUNTER — Ambulatory Visit
Admission: RE | Admit: 2014-02-19 | Discharge: 2014-02-19 | Disposition: A | Discharge status: Home / Self Care | Payer: 59 | Source: Ambulatory Visit | Attending: Internal Medicine | Admitting: Internal Medicine

## 2014-02-19 DIAGNOSIS — E042 Nontoxic multinodular goiter: Secondary | ICD-10-CM

## 2014-05-16 IMAGING — US US SOFT TISSUE HEAD/NECK
1 series · 14 of 25 positions shown · non-contrast
Comparison: None.

CLINICAL DATA: Goiter

THYROID ULTRASOUND
TECHNIQUE: Ultrasound examination of the thyroid gland and adjacent
soft tissues was performed.

[Series 1: us soft tissue head/neck · 0.05mm/px · 14 of 43 slices shown]
[im 1/43]
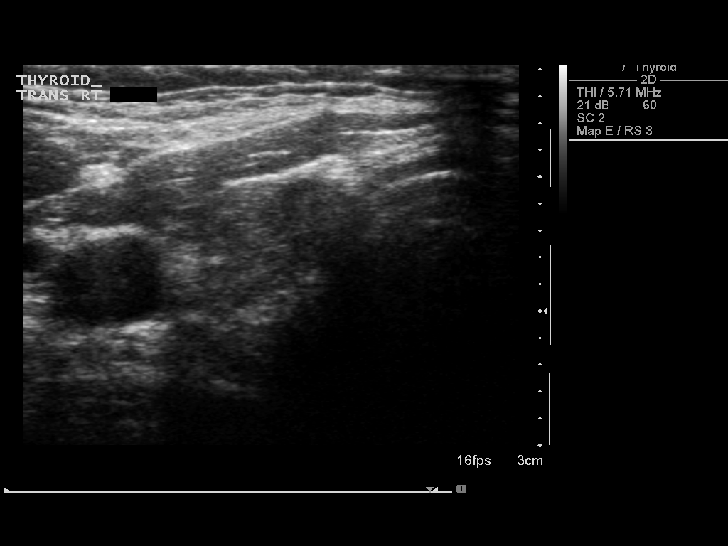
[im 4/43]
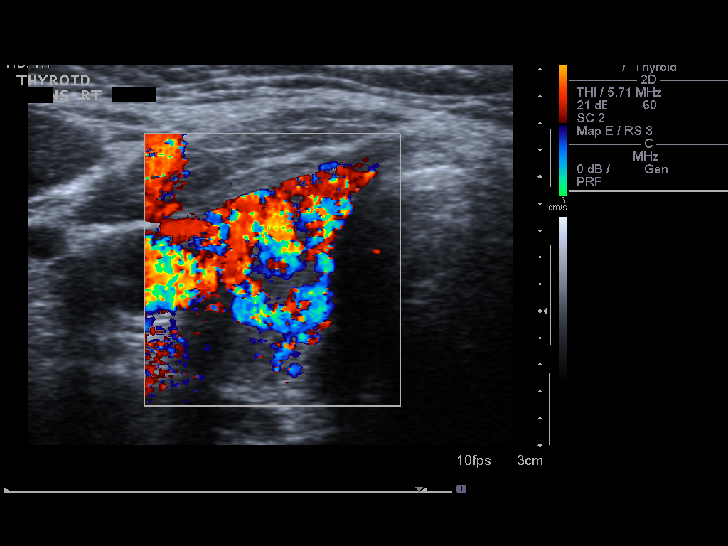
[im 8/43]
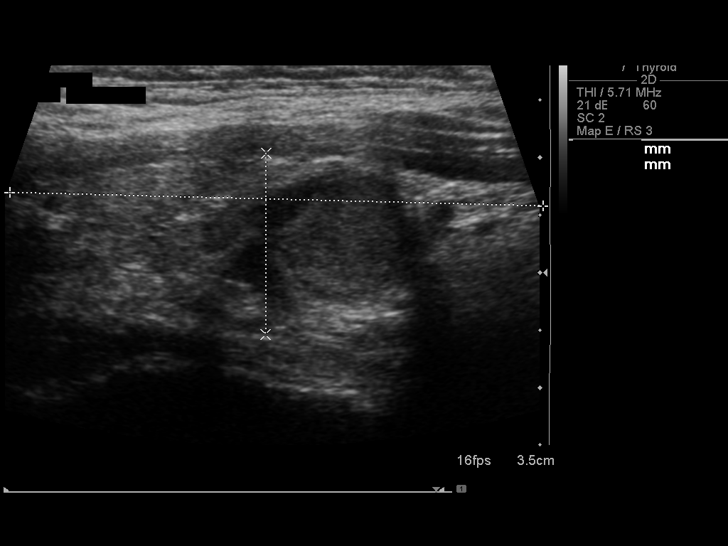
[im 11/43]
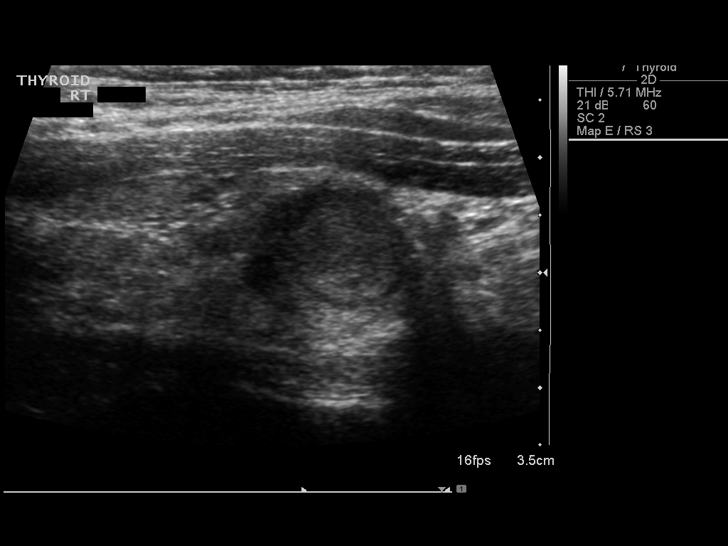
[im 15/43]
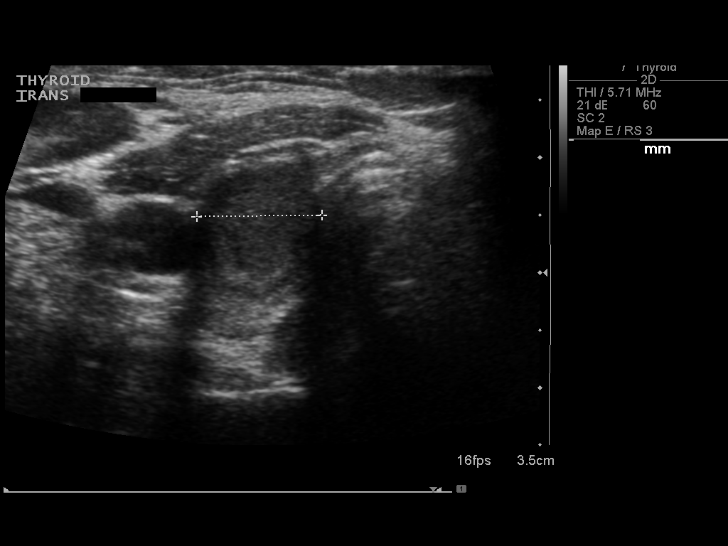
[im 16/43]
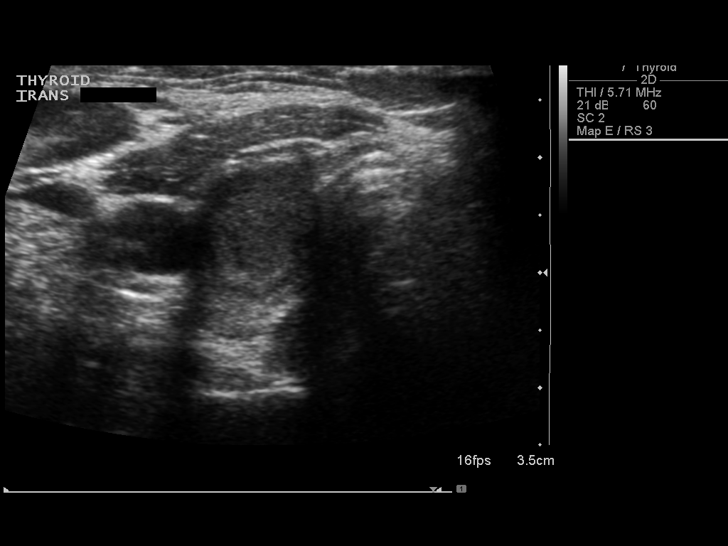
[im 20/43]
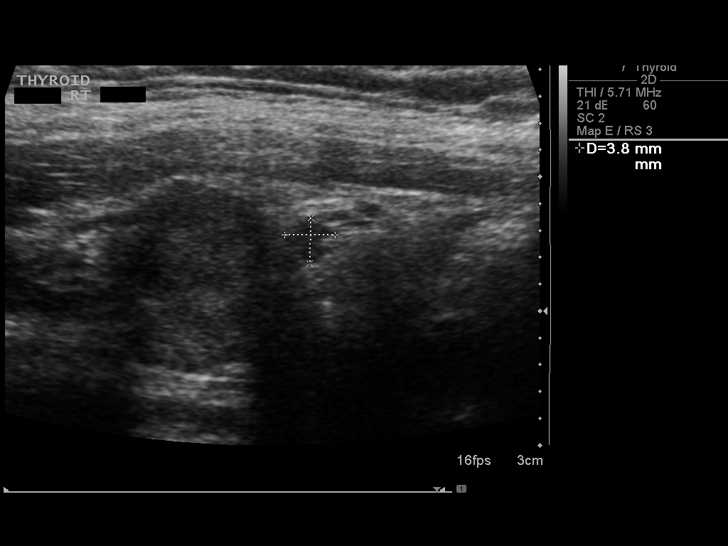
[im 23/43]
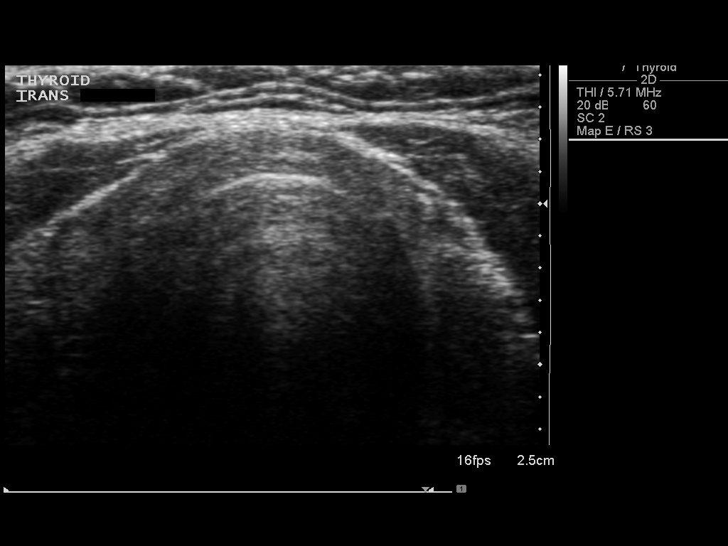
[im 27/43]
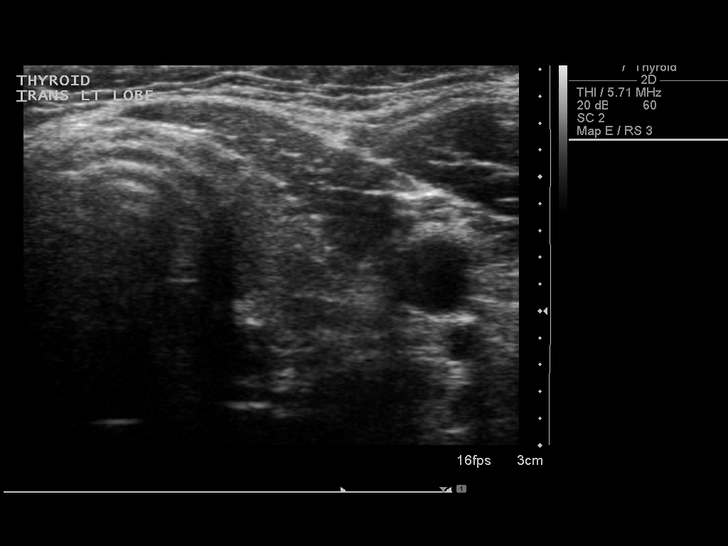
[im 29/43]
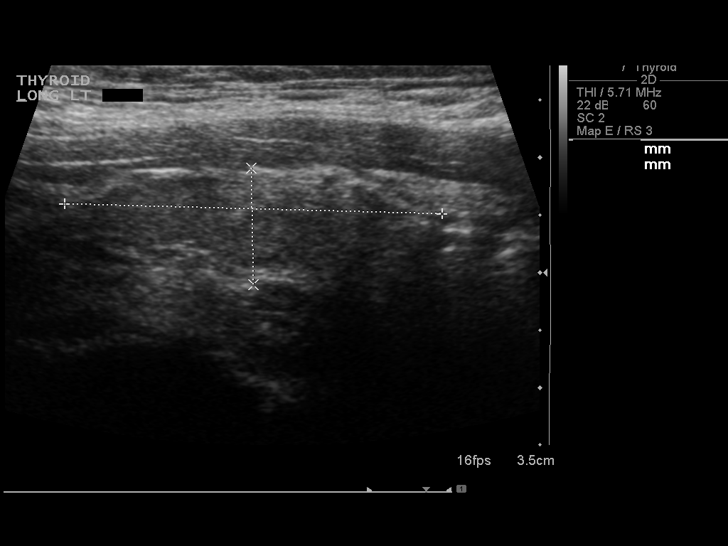
[im 32/43]
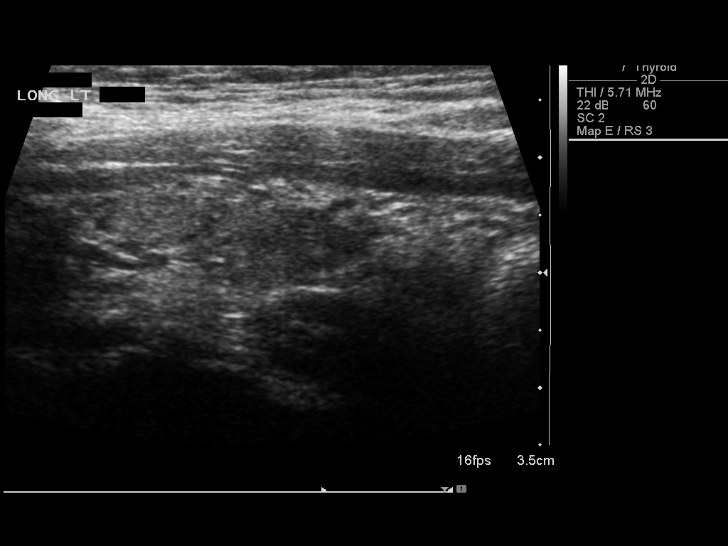
[im 36/43]
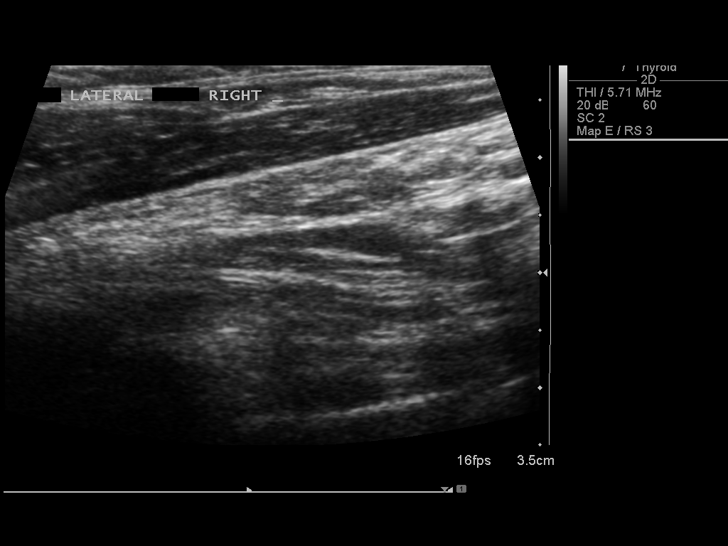
[im 39/43]
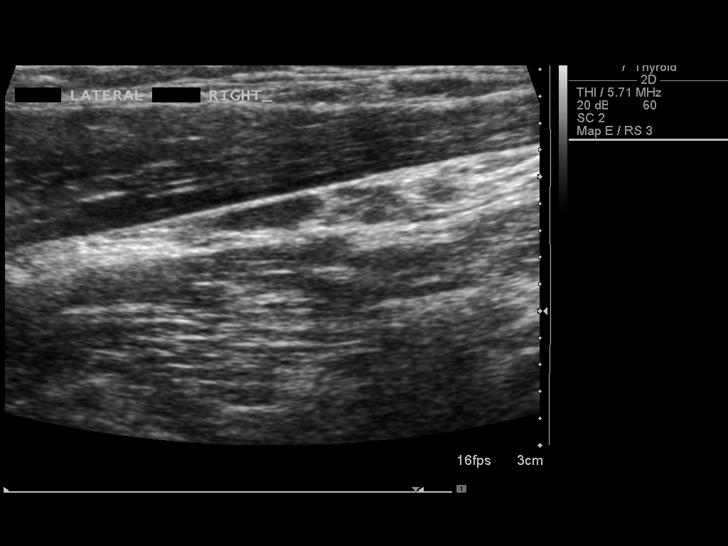
[im 43/43]
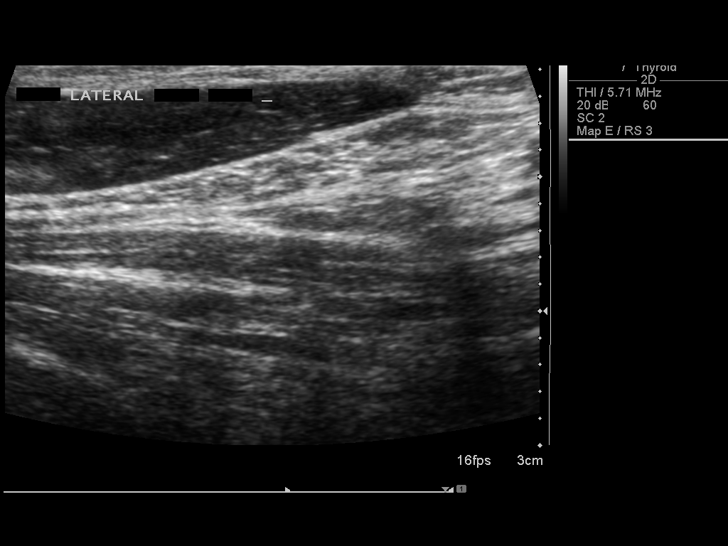

[14 of 25 positions shown; findings below may reference images not displayed]

FINDINGS: Right thyroid lobe:  13 x 16 x 46 mm, inhomogeneous echotexture
Left thyroid lobe:  10 x 10 x 33 mm
Isthmus:  2 mm in thickness

Focal nodules:  11 x 13 x 18 mm solid hyperemic, mid-right
3 x 3 x 4 mm hypoechoic solid, inferior right

Lymphadenopathy:  None visualized.
IMPRESSION: 18 mm dominant right thyroid nodule. Findings meet consensus
criteria for biopsy.  Ultrasound-guided fine needle aspiration
should be considered, as per the consensus statement: Management of
Thyroid Nodules Detected at US:  Society [employer] in
800.

## 2015-02-22 DIAGNOSIS — E039 Hypothyroidism, unspecified: Secondary | ICD-10-CM | POA: Diagnosis not present

## 2015-02-24 DIAGNOSIS — E039 Hypothyroidism, unspecified: Secondary | ICD-10-CM | POA: Diagnosis not present

## 2015-02-24 DIAGNOSIS — E063 Autoimmune thyroiditis: Secondary | ICD-10-CM | POA: Diagnosis not present

## 2015-02-24 DIAGNOSIS — F17201 Nicotine dependence, unspecified, in remission: Secondary | ICD-10-CM | POA: Diagnosis not present

## 2015-02-24 DIAGNOSIS — Z6830 Body mass index (BMI) 30.0-30.9, adult: Secondary | ICD-10-CM | POA: Diagnosis not present

## 2015-02-24 DIAGNOSIS — E042 Nontoxic multinodular goiter: Secondary | ICD-10-CM | POA: Diagnosis not present

## 2015-02-24 DIAGNOSIS — E669 Obesity, unspecified: Secondary | ICD-10-CM | POA: Diagnosis not present

## 2015-03-02 ENCOUNTER — Encounter: Payer: Self-pay | Admitting: Family Medicine

## 2015-03-02 ENCOUNTER — Ambulatory Visit (INDEPENDENT_AMBULATORY_CARE_PROVIDER_SITE_OTHER): Payer: 59 | Admitting: Family Medicine

## 2015-03-02 VITALS — BP 106/64 | HR 72 | Temp 98.4°F | Wt 200.0 lb

## 2015-03-02 DIAGNOSIS — R21 Rash and other nonspecific skin eruption: Secondary | ICD-10-CM | POA: Diagnosis not present

## 2015-03-02 DIAGNOSIS — R61 Generalized hyperhidrosis: Secondary | ICD-10-CM | POA: Diagnosis not present

## 2015-03-02 DIAGNOSIS — R5383 Other fatigue: Secondary | ICD-10-CM | POA: Diagnosis not present

## 2015-03-02 LAB — CBC WITH DIFFERENTIAL/PLATELET
Basophils Absolute: 0 10*3/uL (ref 0.0–0.1)
Basophils Relative: 0.6 % (ref 0.0–3.0)
Eosinophils Absolute: 0.2 10*3/uL (ref 0.0–0.7)
Eosinophils Relative: 3.3 % (ref 0.0–5.0)
HEMATOCRIT: 46.6 % — AB (ref 36.0–46.0)
Hemoglobin: 15.8 g/dL — ABNORMAL HIGH (ref 12.0–15.0)
LYMPHS PCT: 33.6 % (ref 12.0–46.0)
Lymphs Abs: 1.9 10*3/uL (ref 0.7–4.0)
MCHC: 33.9 g/dL (ref 30.0–36.0)
MCV: 89.5 fl (ref 78.0–100.0)
MONOS PCT: 4.7 % (ref 3.0–12.0)
Monocytes Absolute: 0.3 10*3/uL (ref 0.1–1.0)
Neutro Abs: 3.2 10*3/uL (ref 1.4–7.7)
Neutrophils Relative %: 57.8 % (ref 43.0–77.0)
Platelets: 230 10*3/uL (ref 150.0–400.0)
RBC: 5.21 Mil/uL — AB (ref 3.87–5.11)
RDW: 12 % (ref 11.5–15.5)
WBC: 5.5 10*3/uL (ref 4.0–10.5)

## 2015-03-02 LAB — LUTEINIZING HORMONE: LH: 4.5 m[IU]/mL

## 2015-03-02 LAB — COMPREHENSIVE METABOLIC PANEL
ALBUMIN: 4.6 g/dL (ref 3.5–5.2)
ALK PHOS: 65 U/L (ref 39–117)
ALT: 17 U/L (ref 0–35)
AST: 15 U/L (ref 0–37)
BILIRUBIN TOTAL: 0.7 mg/dL (ref 0.2–1.2)
BUN: 12 mg/dL (ref 6–23)
CALCIUM: 9.7 mg/dL (ref 8.4–10.5)
CO2: 31 mEq/L (ref 19–32)
Chloride: 102 mEq/L (ref 96–112)
Creatinine, Ser: 0.82 mg/dL (ref 0.40–1.20)
GFR: 81.64 mL/min (ref >60.00)
Glucose, Bld: 94 mg/dL (ref 70–99)
Potassium: 4.3 mEq/L (ref 3.5–5.1)
Sodium: 140 mEq/L (ref 135–145)
TOTAL PROTEIN: 7.2 g/dL (ref 6.0–8.3)

## 2015-03-02 LAB — VITAMIN B12: VITAMIN B 12: 412 pg/mL (ref 211–911)

## 2015-03-02 LAB — IBC PANEL
Iron: 145 ug/dL (ref 42–145)
Saturation Ratios: 43.5 % (ref 20.0–50.0)
Transferrin: 238 mg/dL (ref 212.0–360.0)

## 2015-03-02 LAB — VITAMIN D 25 HYDROXY (VIT D DEFICIENCY, FRACTURES): VITD: 27.32 ng/mL — AB (ref 30.00–100.00)

## 2015-03-02 LAB — SEDIMENTATION RATE: Sed Rate: 10 mm/hr (ref 0–22)

## 2015-03-02 LAB — FOLLICLE STIMULATING HORMONE: FSH: 11 m[IU]/mL

## 2015-03-02 NOTE — Progress Notes (Signed)
Pre visit review using our clinic review tool, if applicable. No additional management support is needed unless otherwise documented below in the visit note. 

## 2015-03-02 NOTE — Progress Notes (Signed)
Subjective:   Patient ID: Lindsey Cuevas, female    DOB: May 26, 1975, 40 y.o.   MRN: [id number]  Ninfa CHRISTIANNE MCCARSON is a pleasant 40 y.o. year old female who presents to clinic today with Rash and Night Sweats  on 03/02/2015  HPI:  Here for multiple complaints- she is having difficulty focusing on one complaint at a time.  Rash- has noticed for past few months- 7- 10 days prior to her menstrual cycle, she develops a rash on her "entire body." Once her period starts, it starts to improve but still feels like skin is sunburned. Also gets nights sweats and watery eyes with this. Has been taking a Benadryl nightly which does help with symptoms.    Followed by Dr. Buddy Duty for hypothyroidism- just saw him this month- labs normal. Remains on synthroid 100 mcg daily and cytomel was added due to fatigue.  Also having "brain fog."  Very tired.  Thinks she is allergic to progesterone and estrogen.  She is also concerned this is autoimmune since she does have h/o hashimotos.  She is also concerned that perhaps she is having a reaction the saline breast implants she had inserted over a decade ago.  Current Outpatient Prescriptions on File Prior to Visit  Medication Sig Dispense Refill  . cetirizine (ZYRTEC) 10 MG tablet Take 10 mg by mouth at bedtime as needed for allergies.    Mariane Baumgarten Sodium (COLACE PO) Take 1 capsule by mouth 2 (two) times daily as needed.     . Multiple Vitamin (MULTIVITAMIN) tablet Take 1 tablet by mouth daily.     No current facility-administered medications on file prior to visit.    Allergies  Allergen Reactions  . Percocet [Oxycodone-Acetaminophen] Nausea And Vomiting    Past Medical History  Diagnosis Date  . Hashimoto's disease   . Thyroid nodule   . Depression   . Hemorrhoids   . Chronic fatigue   . Fibromyalgia   . Perianal fistula   . Heart murmur     MILD AND PT ASYMPTOMATIC  . GERD (gastroesophageal reflux disease)     WATCH DIET  . History of kidney  stones     Past Surgical History  Procedure Laterality Date  . Biopsy thyroid Right 11/25/2011    Neg  . Incision and drainage peritonsillar abscess Right 10/15/2011  . Leep  AGE 20  . Wisdom tooth extraction  YRS AGO  . Colonoscopy  X3   LAST ONE 05/ 2015  . Placement of breast implants Bilateral 2005  . Anal fistulotomy N/A 09/14/2013    Procedure: ANAL EUA, FISTULOTOMY;  Surgeon: Leighton Ruff, MD;  Location: [facility];  Service: General;  Laterality: N/A;    Family History  Problem Relation Age of Onset  . Colon cancer Mother 20  . Cancer Mother     colon  . Asthma Father   . Hyperlipidemia Father   . Hypertension Father   . Inflammatory bowel disease Brother   . Cancer Maternal Grandmother     breast CA  . Diabetes Maternal Grandmother   . Colon cancer Maternal Uncle     in his 34s  . Cancer Maternal Uncle     colon  . Colon cancer Cousin   . Colon cancer Paternal Uncle   . Cancer Paternal Uncle     colon  . Crohn's disease Brother   . Colon polyps Brother   . Colon polyps Maternal Uncle   . Heart disease Paternal Grandmother   .  Heart disease Paternal Grandfather   . Kidney disease Neg Hx   . Liver disease Neg Hx   . Cancer Paternal Uncle     lung    Social History   Social History  . Marital Status: Married    Spouse Name: N/A  . Number of Children: 0  . Years of Education: N/A   Occupational History  . Case manager at [employer] History Main Topics  . Smoking status: Former Smoker -- 0.25 packs/day for 15 years    Types: Cigarettes  . Smokeless tobacco: Never Used     Comment: PT CURRENTLY TRYING TO QUIT  . Alcohol Use: No  . Drug Use: No  . Sexual Activity: Not on file   Other Topics Concern  . Not on file   Social History Narrative   The PMH, PSH, Social History, Family History, Medications, and allergies have been reviewed in [facility], and have been updated if relevant.   Review of Systems  Constitutional:  Positive for fatigue. Negative for fever.  HENT: Negative.   Eyes: Positive for itching.  Respiratory: Negative.   Cardiovascular: Negative.   Gastrointestinal: Negative.   Endocrine: Negative.   Genitourinary: Negative.   Musculoskeletal: Negative.   Skin: Positive for rash.  Allergic/Immunologic: Negative.   Hematological: Negative.   Psychiatric/Behavioral: Positive for decreased concentration. Negative for hallucinations. The patient is not nervous/anxious and is not hyperactive.   All other systems reviewed and are negative.      Objective:    BP 106/64 mmHg  Pulse 72  Temp(Src) 98.4 F (36.9 C) (Oral)  Wt 200 lb (90.719 kg)  SpO2 98%  LMP 02/22/2015   Physical Exam  Constitutional: She is oriented to person, place, and time. She appears well-developed and well-nourished. No distress.  HENT:  Head: Normocephalic.  Eyes: Conjunctivae are normal.  Cardiovascular: Normal rate.   Pulmonary/Chest: Effort normal.  Abdominal: Soft.  Musculoskeletal: Normal range of motion.  Neurological: She is alert and oriented to person, place, and time. No cranial nerve deficit.  Skin: No rash noted. She is not diaphoretic.  Psychiatric: She has a normal mood and affect. Her behavior is normal. Judgment and thought content normal.  Nursing note and vitals reviewed.         Assessment & Plan:   Rash and nonspecific skin eruption No Follow-up on file.

## 2015-03-02 NOTE — Addendum Note (Signed)
Addended by: Ellamae Sia on: 03/02/2015 03:18 PM   Modules accepted: Orders

## 2015-03-02 NOTE — Patient Instructions (Signed)
Great to see you. Please stop by to see Rosaria Ferries your way out.

## 2015-03-02 NOTE — Assessment & Plan Note (Signed)
>  25 minutes spent in face to face time with patient, >50% spent in counselling or coordination of care concerning night sweats, rash, fatigue. Constellation of symptoms- unclear if and how they all may be related. Rash does seem allergic since antihistamines do help- refer to allergist.  Labs tests today as part of initial work up. The patient indicates understanding of these issues and agrees with the plan.

## 2015-03-03 LAB — ANTI-NUCLEAR AB-TITER (ANA TITER): ANA Titer 1: 1:160 {titer} — ABNORMAL HIGH

## 2015-03-03 LAB — PROGESTERONE: Progesterone: 0.4 ng/mL

## 2015-03-03 LAB — ANA: ANA: POSITIVE — AB

## 2015-03-03 LAB — PROLACTIN: PROLACTIN: 10.3 ng/mL

## 2015-03-08 ENCOUNTER — Encounter: Payer: Self-pay | Admitting: Allergy and Immunology

## 2015-03-08 ENCOUNTER — Ambulatory Visit (INDEPENDENT_AMBULATORY_CARE_PROVIDER_SITE_OTHER): Payer: 59 | Admitting: Allergy and Immunology

## 2015-03-08 VITALS — BP 104/70 | HR 74 | Temp 98.4°F | Resp 16 | Ht 66.93 in | Wt 199.7 lb

## 2015-03-08 DIAGNOSIS — J3089 Other allergic rhinitis: Secondary | ICD-10-CM | POA: Diagnosis not present

## 2015-03-08 DIAGNOSIS — L309 Dermatitis, unspecified: Secondary | ICD-10-CM | POA: Diagnosis not present

## 2015-03-08 MED ORDER — EPINEPHRINE 0.3 MG/0.3ML IJ SOAJ: INTRAMUSCULAR | Status: AC

## 2015-03-08 MED ORDER — FLUTICASONE PROPIONATE 50 MCG/ACT NA SUSP: NASAL | Status: AC

## 2015-03-08 MED ORDER — RANITIDINE HCL 150 MG PO TABS: ORAL_TABLET | ORAL | Status: AC

## 2015-03-08 MED ORDER — LEVOCETIRIZINE DIHYDROCHLORIDE 5 MG PO TABS: ORAL_TABLET | ORAL | Status: AC

## 2015-03-08 NOTE — Patient Instructions (Addendum)
Perennial and seasonal allergic rhinoconjunctivitis  Aeroallergen avoidance measures have been discussed and provided in written form.  A prescription has been provided for fluticasone nasal spray, 2 sprays per nostril daily as needed. Proper nasal spray technique has been discussed and demonstrated.  I have also recommended nasal saline spray (i.e. Simply Saline) as needed prior to medicated nasal sprays.  A prescription has been provided for levocetirizine, 5 mg daily as needed.  If allergen avoidance measures and medications fail to adequately relieve symptoms, aeroallergen immunotherapy will be considered.  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 This is a rare, cyclical eruption that occurs in the luteal phase of the menstrual cycle and during pregnancy. Many manifestations have been reported including cyclical eczema, urticaria, erythema multiforme, stomatitis and even anaphylaxis. The condition spontaneously resolves after menopause. Diagnosis is confirmed with a progesterone challenge or a therapeutic trial of LHRH agonist to suppress estrogen and progesterone synthesis for several months. Based upon the progression of her symptoms and the potential risk of autoimmune progesterone anaphylaxis, an epinephrine autoinjector 2 pack will be prescribed. It should be noted that food allergen skin tests were borderline positive to [place] and chicken however she consumes these foods on a regular basis without symptoms, therefore these represent false positive results.  For now, we will treat symptomatically with antihistamine titration.   A prescription has been provided for EpiPen 0.3 mg 2 pack along with instructions for its proper administration.  I will contact the patient's gynecologist to for assistance in determining the next step in diagnosis and treatment.    I will call the patient with further recommendations and follow up instructions after I have  discussed her case with her gynecologist.  Urticaria (Hives)  . Levocetirizine (Xyzal) 5 mg in morning and Cetirizine (Zyrtec) 10mg  at night and ranitidine (Zantac) 150 mg twice a day. If no symptoms for 7-14 days then decrease to. . Levocetirizine (Xyzal) 5 mg in morning and Cetirizine (Zyrtec) 10mg  at night and ranitidine (Zantac) 150 mg once a day.  If no symptoms for 7-14 days then decrease to. . Levocetirizine (Xyzal) 5 mg in morning and Cetirizine (Zyrtec) 10mg  at night.  If no symptoms for 7-14 days then decrease to. . Levocetirizine (Xyzal) 5 mg once a day.  May use Benadryl (diphenhydramine) as needed for breakthrough symptoms       If symptoms return, then step up dosage  Reducing Pollen Exposure  The American Academy of Allergy, Asthma and Immunology suggests the following steps to reduce your exposure to pollen during allergy seasons.    1. Do not hang sheets or clothing out to dry; pollen may collect on these items. 2. Do not mow lawns or spend time around freshly cut grass; mowing stirs up pollen. 3. Keep windows closed at night.  Keep car windows closed while driving. 4. Minimize morning activities outdoors, a time when pollen counts are usually at their highest. 5. Stay indoors as much as possible when pollen counts or humidity is high and on windy days when pollen tends to remain in the air longer. 6. Use air conditioning when possible.  Many air conditioners have filters that trap the pollen spores. 7. Use a HEPA room air filter to remove pollen form the indoor air you breathe.   Control of House Dust Mite Allergen  House dust mites play a major role in allergic asthma and rhinitis.  They occur in environments with high humidity wherever human skin, the food for dust mites is found. High  levels have been detected in dust obtained from mattresses, pillows, carpets, upholstered furniture, bed covers, clothes and soft toys.  The principal allergen of the house dust mite is  found in its feces.  A gram of dust may contain 1,000 mites and 250,000 fecal particles.  Mite antigen is easily measured in the air during house cleaning activities.    1. Encase mattresses, including the box spring, and pillow, in an air tight cover.  Seal the zipper end of the encased mattresses with wide adhesive tape. 2. Wash the bedding in water of 130 degrees Farenheit weekly.  Avoid cotton comforters/quilts and flannel bedding: the most ideal bed covering is the dacron comforter. 3. Remove all upholstered furniture from the bedroom. 4. Remove carpets, carpet padding, rugs, and non-washable window drapes from the bedroom.  Wash drapes weekly or use plastic window coverings. 5. Remove all non-washable stuffed toys from the bedroom.  Wash stuffed toys weekly. 6. Have the room cleaned frequently with a vacuum cleaner and a damp dust-mop.  The patient should not be in a room which is being cleaned and should wait 1 hour after cleaning before going into the room. 7. Close and seal all heating outlets in the bedroom.  Otherwise, the room will become filled with dust-laden air.  An electric heater can be used to heat the room. 8. Reduce indoor humidity to less than 50%.  Do not use a humidifier.  Control of Cockroach Allergen  Cockroach allergen has been identified as an important cause of acute attacks of asthma, especially in urban settings.  There are fifty-five species of cockroach that exist in the [place], however only three, the [place], [place] species produce allergen that can affect patients with Asthma.  Allergens can be obtained from fecal particles, egg casings and secretions from cockroaches.    1. Remove food sources. 2. Reduce access to water. 3. Seal access and entry points. 4. Spray runways with 0.5-1% Diazinon or Chlorpyrifos 5. Blow boric acid power under stoves and refrigerator. 6. Place bait stations (hydramethylnon) at feeding sites.  Control of Dog or  Cat Allergen  Avoidance is the best way to manage a dog or cat allergy. If you have a dog or cat and are allergic to dog or cats, consider removing the dog or cat from the home. If you have a dog or cat but don't want to find it a new home, or if your family wants a pet even though someone in the household is allergic, here are some strategies that may help keep symptoms at bay:  1. Keep the pet out of your bedroom and restrict it to only a few rooms. Be advised that keeping the dog or cat in only one room will not limit the allergens to that room. 2. Don't pet, hug or kiss the dog or cat; if you do, wash your hands with soap and water. 3. High-efficiency particulate air (HEPA) cleaners run continuously in a bedroom or living room can reduce allergen levels over time. 4. Regular use of a high-efficiency vacuum cleaner or a central vacuum can reduce allergen levels. 5. Giving your dog or cat a bath at least once a week can reduce airborne allergen.  Control of Mold Allergen  Mold and fungi can grow on a variety of surfaces provided certain temperature and moisture conditions exist.  Outdoor molds grow on plants, decaying vegetation and soil.  The major outdoor mold, Alternaria and Cladosporium, are found in very high numbers during  hot and dry conditions.  Generally, a late Summer - Fall peak is seen for common outdoor fungal spores.  Rain will temporarily lower outdoor mold spore count, but counts rise rapidly when the rainy period ends.  The most important indoor molds are Aspergillus and Penicillium.  Dark, humid and poorly ventilated basements are ideal sites for mold growth.  The next most common sites of mold growth are the bathroom and the kitchen.  Outdoor [employer] 2. Use air conditioning and keep windows closed 3. Avoid exposure to decaying vegetation. 4. Avoid leaf raking. 5. Avoid grain handling. 6. Consider wearing a face mask if working in moldy areas.  Indoor Mold  Control 2. Maintain humidity below 50%. 3. Clean washable surfaces with 5% bleach solution. 4. Remove sources e.g. Contaminated carpets.

## 2015-03-08 NOTE — Assessment & Plan Note (Signed)
   Aeroallergen avoidance measures have been discussed and provided in written form.  A prescription has been provided for fluticasone nasal spray, 2 sprays per nostril daily as needed. Proper nasal spray technique has been discussed and demonstrated.  I have also recommended nasal saline spray (i.e. Simply Saline) as needed prior to medicated nasal sprays.  A prescription has been provided for levocetirizine, 5 mg daily as needed.  If allergen avoidance measures and medications fail to adequately relieve symptoms, aeroallergen immunotherapy will be considered.

## 2015-03-08 NOTE — Assessment & Plan Note (Addendum)
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 This is a rare, cyclical eruption that occurs in the luteal phase of the menstrual cycle and during pregnancy. Many manifestations have been reported including cyclical eczema, urticaria, erythema multiforme, stomatitis and even anaphylaxis. The condition spontaneously resolves after menopause. Diagnosis is confirmed with a progesterone challenge or a therapeutic trial of LHRH agonist to suppress estrogen and progesterone synthesis for several months. Based upon the progression of her symptoms and the potential risk of autoimmune progesterone anaphylaxis, an epinephrine autoinjector 2 pack will be prescribed. It should be noted that food allergen skin tests were borderline positive to [place] and chicken however she consumes these foods on a regular basis without symptoms, therefore these represent false positive results.  For now, we will treat symptomatically with antihistamine titration.   A prescription has been provided for EpiPen 0.3 mg 2 pack along with instructions for its proper administration.  I will contact the patient's gynecologist to for assistance in determining the next step in diagnosis and treatment.

## 2015-03-08 NOTE — Progress Notes (Addendum)
New Patient Note  RE: Lindsey Cuevas MRN: [id number] DOB: [birth year] Date of Office Visit: 03/08/2015  Referring provider: Lucille Passy, MD Primary care provider: Arnette Norris, MD  Chief Complaint: Rash and Allergic Rhinitis    History of present illness: HPI Comments: Lindsey Cuevas is a 40 y.o. female who presents today for consultation for rash and rhinitis.  She complains of nasal congestion, rhinorrhea, postnasal drainage, ocular pruritus, and puffy eyelids.  These symptoms occur year around but tender be more severe in the springtime in the fall.  She also complains of a rash on her arms, abdomen, thighs, back, and cheeks.  The rash is described as itching/burning and red with occasional scattered hives.  She denies vesicles.  She states that the rash becomes increases in severity approximately 7-10 days prior to menses and decrescendos after menses.  She experienced a similar rash in the past after having started Sprintec.  The symptoms resolved after discontinuing Sprintec.  Along with the rash, she experiences fatigue and myalgia.  She carries a diagnosis of Hashimoto's thyroiditis, chronic fatigue syndrome, and fibromyalgia with elevated ANA.   Assessment and plan: Perennial and seasonal allergic rhinoconjunctivitis  Aeroallergen avoidance measures have been discussed and provided in written form.  A prescription has been provided for fluticasone nasal spray, 2 sprays per nostril daily as needed. Proper nasal spray technique has been discussed and demonstrated.  I have also recommended nasal saline spray (i.e. Simply Saline) as needed prior to medicated nasal sprays.  A prescription has been provided for levocetirizine, 5 mg daily as needed.  If allergen avoidance measures and medications fail to adequately relieve symptoms, aeroallergen immunotherapy will be considered.  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 This is a rare, cyclical eruption that  occurs in the luteal phase of the menstrual cycle and during pregnancy. Many manifestations have been reported including cyclical eczema, urticaria, erythema multiforme, stomatitis and even anaphylaxis. The condition spontaneously resolves after menopause. Diagnosis is confirmed with a progesterone challenge or a therapeutic trial of LHRH agonist to suppress estrogen and progesterone synthesis for several months. Based upon the progression of her symptoms and the potential risk of autoimmune progesterone anaphylaxis, an epinephrine autoinjector 2 pack will be prescribed. It should be noted that food allergen skin tests were borderline positive to [place] and chicken however she consumes these foods on a regular basis without symptoms, therefore these represent false positive results.  For now, we will treat symptomatically with antihistamine titration.   A prescription has been provided for EpiPen 0.3 mg 2 pack along with instructions for its proper administration.  I will contact the patient's gynecologist to for assistance in determining the next step in diagnosis and treatment.    Meds ordered this encounter  Medications  . fluticasone (FLONASE) 50 MCG/ACT nasal spray    Sig: Use two sprays in each nostril once daily as needed    Dispense:  16 g    Refill:  5  . levocetirizine (XYZAL) 5 MG tablet    Sig: Take one tablet once daily as needed for runny nose or itching    Dispense:  30 tablet    Refill:  5  . EPINEPHrine 0.3 mg/0.3 mL IJ SOAJ injection    Sig: USE AS DIRECTED FOR LIFE THREATENING ALLERGIC REACTION    Dispense:  4 Device    Refill:  2    PT HAS BRAND COUPON IF MYLAN GENERIC IS CHEAPER PLEASE DISPENSE  . ranitidine (ZANTAC) 150 MG  tablet    Sig: TAKE ONE TABLET TWICE DAILY AS DIRECTED    Dispense:  60 tablet    Refill:  5    Diagnositics: Environmental skin testing: Positive to grass pollen, weed pollen, ragweed pollen, tree pollen, mold, cat hair, dog epithelia, dust  mite, cockroach antigen. Food allergen skin testing: Borderline positive to [place] and chicken. However she consumes these foods on a regular basis without symptoms, therefore they represent false positive results.    Physical examination: Blood pressure 104/70, pulse 74, temperature 98.4 F (36.9 C), temperature source Oral, resp. rate 16, height 5' 6.93" (1.7 m), weight 199 lb 11.8 oz (90.6 kg), last menstrual period 02/22/2015.  General: Alert, interactive, in no acute distress. HEENT: TMs pearly gray, turbinates edematous with clear discharge, post-pharynx erythematous. Neck: Supple without lymphadenopathy. Lungs: Clear to auscultation without wheezing, rhonchi or rales. CV: Normal S1, S2 without murmurs. Abdomen: Nondistended, nontender. Skin: Mild diffuse erythema on the upper arms and chest.. Extremities:  No clubbing, cyanosis or edema. Neuro:   Grossly intact.  Review of systems: Review of Systems  Constitutional: Positive for malaise/fatigue. Negative for fever and weight loss.  HENT: Positive for congestion. Negative for nosebleeds.   Eyes: Negative for blurred vision.  Respiratory: Negative for hemoptysis, shortness of breath and wheezing.   Cardiovascular: Negative for chest pain.  Gastrointestinal: Negative for diarrhea and constipation.  Genitourinary: Negative for dysuria.  Musculoskeletal: Positive for myalgias. Negative for joint pain.  Skin: Positive for itching and rash.  Neurological: Positive for headaches. Negative for dizziness.  Endo/Heme/Allergies: Does not bruise/bleed easily.    Past medical history: Past Medical History  Diagnosis Date  . Hashimoto's disease   . Thyroid nodule   . Depression   . Hemorrhoids   . Chronic fatigue   . Fibromyalgia   . Perianal fistula   . Heart murmur     MILD AND PT ASYMPTOMATIC  . GERD (gastroesophageal reflux disease)     WATCH DIET  . History of kidney stones     Past surgical history: Past Surgical  History  Procedure Laterality Date  . Biopsy thyroid Right 11/25/2011    Neg  . Incision and drainage peritonsillar abscess Right 10/15/2011  . Leep  AGE 89  . Wisdom tooth extraction  YRS AGO  . Colonoscopy  X3   LAST ONE 05/ 2015  . Placement of breast implants Bilateral 2005  . Anal fistulotomy N/A 09/14/2013    Procedure: ANAL EUA, FISTULOTOMY;  Surgeon: Leighton Ruff, MD;  Location: [facility];  Service: General;  Laterality: N/A;    Family history: Family History  Problem Relation Age of Onset  . Colon cancer Mother 58  . Cancer Mother     colon  . Asthma Father   . Hyperlipidemia Father   . Hypertension Father   . Inflammatory bowel disease Brother   . Allergic rhinitis Brother   . Asthma Brother   . Cancer Maternal Grandmother     breast CA  . Diabetes Maternal Grandmother   . Colon cancer Maternal Uncle     in his 3s  . Cancer Maternal Uncle     colon  . Colon cancer Cousin   . Colon cancer Paternal Uncle   . Cancer Paternal Uncle     colon  . Crohn's disease Brother   . Colon polyps Brother   . Colon polyps Maternal Uncle   . Heart disease Paternal Grandmother   . Heart disease Paternal Grandfather   .  Kidney disease Neg Hx   . Liver disease Neg Hx   . Cancer Paternal Uncle     lung  . Urticaria Paternal Aunt     Social history: Social History   Social History  . Marital Status: Married    Spouse Name: N/A  . Number of Children: 0  . Years of Education: N/A   Occupational History  . 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 Smoking status: Former Smoker -- 0.25 packs/day for 15 years    Types: Cigarettes    Quit date: 02/18/2015  . Smokeless tobacco: Never Used     Comment: PT CURRENTLY TRYING TO QUIT  . Alcohol Use: No  . Drug Use: No  . Sexual Activity: Not on file   Other Topics Concern  . Not on file   Social History Narrative   Environmental History: The patient lives in a 41 year old house  with carpeting in the bedroom and central air/heat.  There are 3 cats and one dog in the house, the dog has access to her bedroom.  She quit smoking earlier this month and lives with a cigarette smoker.    Medication List       This list is accurate as of: 03/08/15 11:59 PM.  Always use your most recent med list.               ADVIL PO  Take by mouth.     cetirizine 10 MG tablet  Commonly known as:  ZYRTEC  Take 10 mg by mouth at bedtime as needed for allergies.     COLACE PO  Take 1 capsule by mouth 2 (two) times daily as needed.     diphenhydrAMINE 25 MG tablet  Commonly known as:  SOMINEX  Take 25 mg by mouth at bedtime as needed for sleep.     EPINEPHrine 0.3 mg/0.3 mL Soaj injection  Commonly known as:  EPI-PEN  USE AS DIRECTED FOR LIFE THREATENING ALLERGIC REACTION     FISH OIL PO  Take by mouth.     fluticasone 50 MCG/ACT nasal spray  Commonly known as:  FLONASE  Use two sprays in each nostril once daily as needed     levocetirizine 5 MG tablet  Commonly known as:  XYZAL  Take one tablet once daily as needed for runny nose or itching     levothyroxine 100 MCG tablet  Commonly known as:  SYNTHROID, LEVOTHROID  Take 100 mcg by mouth daily before breakfast.     LEVOXYL 137 MCG tablet  Generic drug:  levothyroxine  TK 1 T PO QD     liothyronine 5 MCG tablet  Commonly known as:  CYTOMEL  Take 5 mcg by mouth 2 (two) times daily.     multivitamin tablet  Take 1 tablet by mouth daily.     ranitidine 150 MG tablet  Commonly known as:  ZANTAC  TAKE ONE TABLET TWICE DAILY AS DIRECTED     VITAMIN C PO  Take by mouth.     VITAMIN D-3 PO  Take by mouth.        Known medication allergies: Allergies  Allergen Reactions  . Percocet [Oxycodone-Acetaminophen] Nausea And Vomiting  . Sprintec 28 [Norgestimate-Eth Estradiol] Itching    I appreciate the opportunity to take part in this Lindsey Cuevas's care. Please do not hesitate to contact me with  questions.  Sincerely,   R. Edgar Frisk, MD

## 2015-03-09 ENCOUNTER — Telehealth: Payer: Self-pay | Admitting: Family Medicine

## 2015-03-09 DIAGNOSIS — E559 Vitamin D deficiency, unspecified: Secondary | ICD-10-CM

## 2015-03-09 DIAGNOSIS — R700 Elevated erythrocyte sedimentation rate: Secondary | ICD-10-CM

## 2015-03-09 DIAGNOSIS — IMO0001 Reserved for inherently not codable concepts without codable children: Secondary | ICD-10-CM | POA: Insufficient documentation

## 2015-03-09 DIAGNOSIS — R768 Other specified abnormal immunological findings in serum: Secondary | ICD-10-CM | POA: Insufficient documentation

## 2015-03-09 NOTE — Telephone Encounter (Signed)
What dx, and pth w/wo calcium

## 2015-03-09 NOTE — Telephone Encounter (Signed)
PTH without calcium- dx- vit d deficiency.

## 2015-03-09 NOTE — Telephone Encounter (Signed)
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 Patient wants to know if you would order these labs on her , Serum PTH, Magnesium, Phosphorus and Calcium, she wants to check these labs out. She wants to check these out before starting to take the Vitamin D. If they are offf then her Endocrinologist Dr Buddy Duty can take it from there but she just wants to know the results before starting the Vitamin D. Please call the patient at [phone]

## 2015-03-09 NOTE — Telephone Encounter (Signed)
We did check her calcium it was normal.  Ok to check Mag, phos and PTH

## 2015-03-09 NOTE — Telephone Encounter (Signed)
Tests ordered, pt scheduled

## 2015-03-09 NOTE — Telephone Encounter (Signed)
I sent her a mychart message regarding her results- see result note. Yes we can refer to rheumatologist- referral placed.

## 2015-03-09 NOTE — Telephone Encounter (Signed)
Pt called regarding her lab results and was asking to have some more labs drawn that weren't drawn. Also she is asking for a referral to a rheumatologist. You can reach her at [phone] for more information.

## 2015-03-10 ENCOUNTER — Other Ambulatory Visit (INDEPENDENT_AMBULATORY_CARE_PROVIDER_SITE_OTHER): Payer: 59

## 2015-03-10 DIAGNOSIS — E559 Vitamin D deficiency, unspecified: Secondary | ICD-10-CM

## 2015-03-10 LAB — PHOSPHORUS: PHOSPHORUS: 3 mg/dL (ref 2.3–4.6)

## 2015-03-10 LAB — MAGNESIUM: MAGNESIUM: 2 mg/dL (ref 1.5–2.5)

## 2015-03-11 LAB — PARATHYROID HORMONE, INTACT (NO CA): PTH: 44 pg/mL (ref 14–64)

## 2015-03-15 ENCOUNTER — Encounter: Payer: Self-pay | Admitting: *Deleted

## 2015-03-17 DIAGNOSIS — R5383 Other fatigue: Secondary | ICD-10-CM | POA: Diagnosis not present

## 2015-03-17 DIAGNOSIS — R768 Other specified abnormal immunological findings in serum: Secondary | ICD-10-CM | POA: Diagnosis not present

## 2015-03-17 DIAGNOSIS — E063 Autoimmune thyroiditis: Secondary | ICD-10-CM | POA: Diagnosis not present

## 2015-03-17 DIAGNOSIS — M791 Myalgia: Secondary | ICD-10-CM | POA: Diagnosis not present

## 2015-03-17 DIAGNOSIS — M25532 Pain in left wrist: Secondary | ICD-10-CM | POA: Diagnosis not present

## 2015-03-17 DIAGNOSIS — M25531 Pain in right wrist: Secondary | ICD-10-CM | POA: Diagnosis not present

## 2015-03-17 DIAGNOSIS — R21 Rash and other nonspecific skin eruption: Secondary | ICD-10-CM | POA: Diagnosis not present

## 2015-03-22 ENCOUNTER — Telehealth: Payer: Self-pay | Admitting: Allergy and Immunology

## 2015-03-22 NOTE — Telephone Encounter (Signed)
Noted. Thanks.

## 2015-03-22 NOTE — Telephone Encounter (Signed)
PT CALLED TO LET YOU KNOW THAT THE MEDS ARE WORKING AND THAT SHE HAS SCHEDULED A APPOINTMENT WITH HER GYN ON April  5 AND A APPOINTMENT WITH A RHEUMATOLOGY.

## 2015-04-07 DIAGNOSIS — E039 Hypothyroidism, unspecified: Secondary | ICD-10-CM | POA: Diagnosis not present

## 2015-05-26 DIAGNOSIS — E669 Obesity, unspecified: Secondary | ICD-10-CM | POA: Diagnosis not present

## 2015-05-26 DIAGNOSIS — E042 Nontoxic multinodular goiter: Secondary | ICD-10-CM | POA: Diagnosis not present

## 2015-05-26 DIAGNOSIS — E063 Autoimmune thyroiditis: Secondary | ICD-10-CM | POA: Diagnosis not present

## 2015-05-26 DIAGNOSIS — E039 Hypothyroidism, unspecified: Secondary | ICD-10-CM | POA: Diagnosis not present

## 2015-05-26 DIAGNOSIS — Z6831 Body mass index (BMI) 31.0-31.9, adult: Secondary | ICD-10-CM | POA: Diagnosis not present

## 2015-08-09 DIAGNOSIS — Z6832 Body mass index (BMI) 32.0-32.9, adult: Secondary | ICD-10-CM | POA: Diagnosis not present

## 2015-08-09 DIAGNOSIS — Z01419 Encounter for gynecological examination (general) (routine) without abnormal findings: Secondary | ICD-10-CM | POA: Diagnosis not present

## 2015-08-11 DIAGNOSIS — Z1231 Encounter for screening mammogram for malignant neoplasm of breast: Secondary | ICD-10-CM | POA: Diagnosis not present

## 2015-11-25 DIAGNOSIS — E063 Autoimmune thyroiditis: Secondary | ICD-10-CM | POA: Diagnosis not present

## 2015-11-25 DIAGNOSIS — E042 Nontoxic multinodular goiter: Secondary | ICD-10-CM | POA: Diagnosis not present

## 2015-11-25 DIAGNOSIS — E039 Hypothyroidism, unspecified: Secondary | ICD-10-CM | POA: Diagnosis not present

## 2015-12-30 IMAGING — CT CT STONE STUDY
1 of 4 series · 5 of 46 positions shown, 10 images · non-contrast
Comparison: No priors.

CLINICAL DATA: Lower left abdominal and pelvic pain.

EXAM:
CT ABDOMEN AND PELVIS WITHOUT CONTRAST
TECHNIQUE: Multidetector CT imaging of the abdomen and pelvis was performed
following the standard protocol without IV contrast.

[Series 4: lung windows · axial · 0.72mm/px · z∈[-193,-113]mm · 5 of 24 slices shown, 10 images]
[im 4/24  soft-tissue]
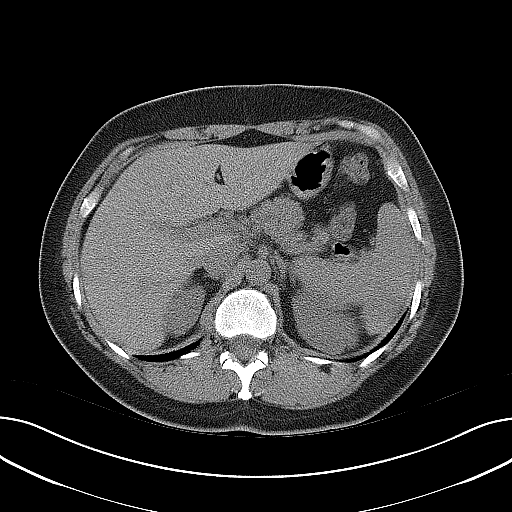
[im 4/24  bone]
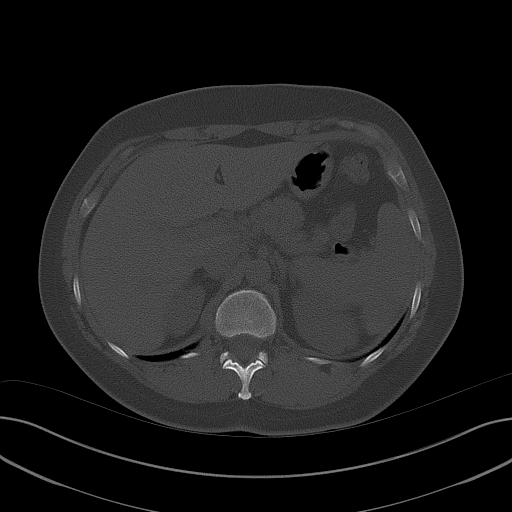
[im 8/24  soft-tissue]
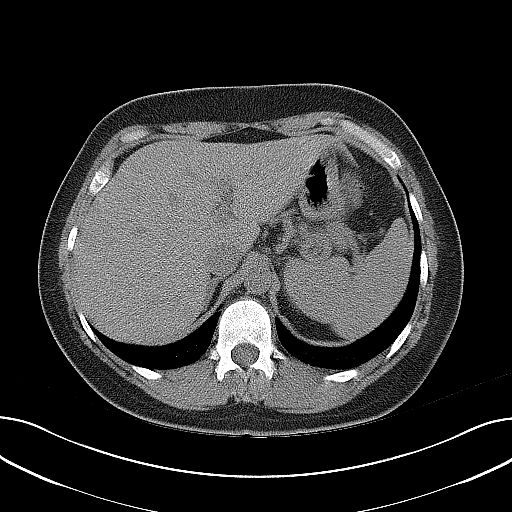
[im 8/24  lung]
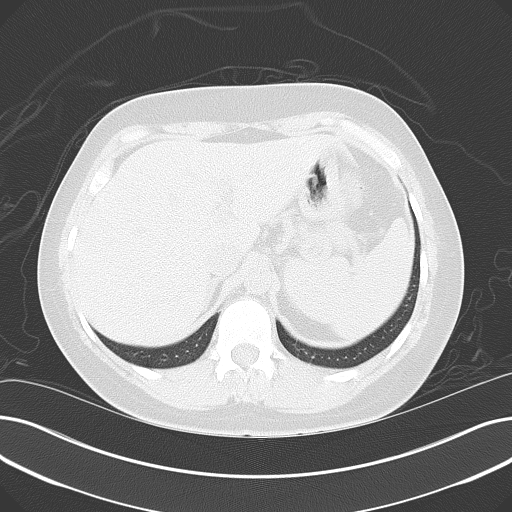
[im 12/24  soft-tissue]
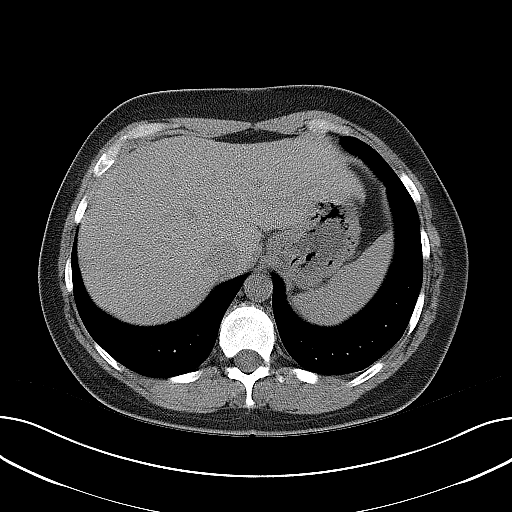
[im 12/24  lung]
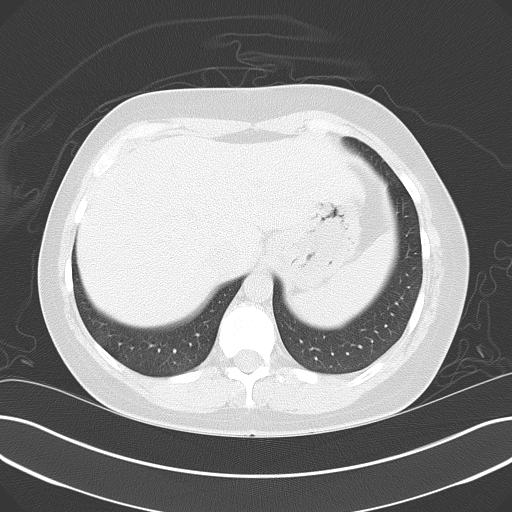
[im 16/24  soft-tissue]
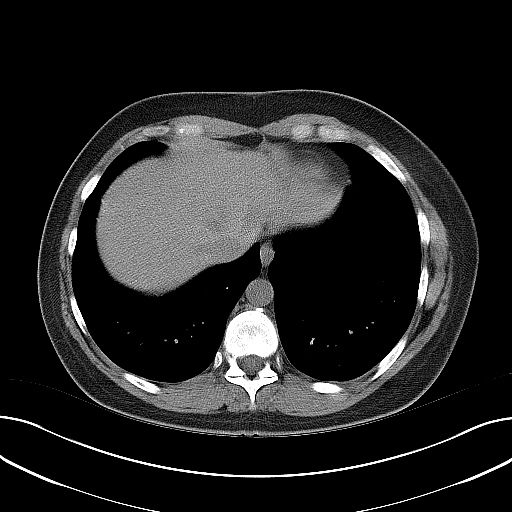
[im 16/24  lung]
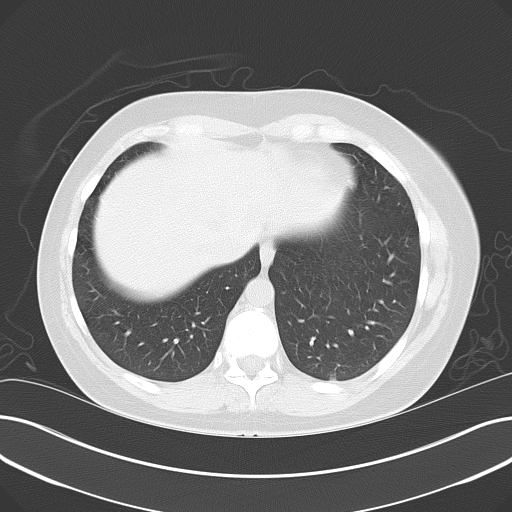
[im 20/24  soft-tissue]
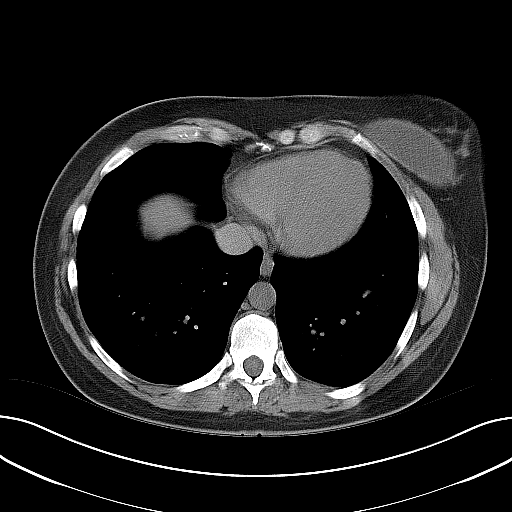
[im 20/24  lung]
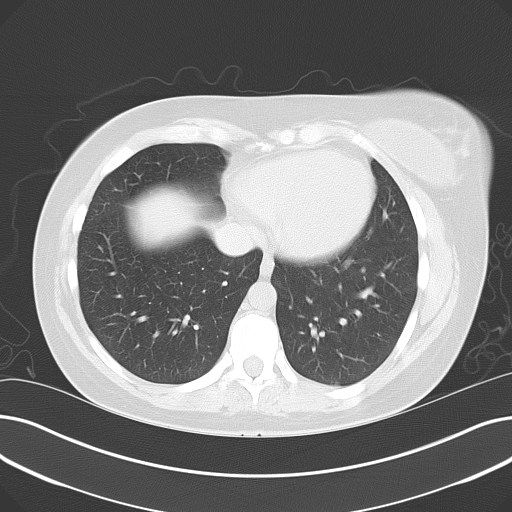

[5 of 46 positions shown; findings below may reference images not displayed]

FINDINGS: Lung Bases: Bilateral breast implants incidentally noted.

Abdomen/Pelvis: Image 74 of series 2 demonstrates a 5 mm calculus in
the distal third of the left ureter shortly before the left
ureterovesicular junction. This is associated with some very mild
fullness of the left ureter and left renal collecting system,
indicative of only mild obstruction at this time. No additional
calculi are noted within the collecting system of either kidney,
along the course of the right ureter, or within the lumen of the
urinary bladder. The unenhanced appearance of the kidneys is
otherwise unremarkable bilaterally.

The unenhanced appearance of the liver, gallbladder, pancreas,
spleen and bilateral adrenal glands is unremarkable. Trace volume of
free fluid in the cul-de-sac is likely physiologic in this young
female patient. No larger volume of ascites. No pneumoperitoneum. No
pathologic distention of small bowel. No definite lymphadenopathy
identified within the abdomen or pelvis on today's non contrast CT
examination. Normal appendix. Uterus and ovaries are unremarkable in
appearance.

Musculoskeletal: There are no aggressive appearing lytic or blastic
lesions noted in the visualized portions of the skeleton.
IMPRESSION: 1. Partially obstructive 5 mm calculus in the distal third of the
left ureter shortly before the left ureterovesicular junction. This
is associated with only mild fullness of the left ureter and left
renal collecting system at this time.
2. Normal appendix.

## 2018-04-21 ENCOUNTER — Encounter: Payer: Self-pay | Admitting: *Deleted
